# Patient Record
Sex: Male | Born: 1970 | Race: White | Hispanic: No | Marital: Married | Smoking: Never smoker
Health system: Southern US, Community
[De-identification: ages and names within clinical notes are randomized; demographics above are authoritative.]

## PROBLEM LIST (undated history)

## (undated) DIAGNOSIS — N200 Calculus of kidney: Secondary | ICD-10-CM

## (undated) DIAGNOSIS — M1990 Unspecified osteoarthritis, unspecified site: Secondary | ICD-10-CM

## (undated) DIAGNOSIS — F419 Anxiety disorder, unspecified: Secondary | ICD-10-CM

## (undated) DIAGNOSIS — K469 Unspecified abdominal hernia without obstruction or gangrene: Secondary | ICD-10-CM

## (undated) HISTORY — PX: HERNIA REPAIR: SHX51

---
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
## 2003-08-02 ENCOUNTER — Emergency Department (HOSPITAL_COMMUNITY): Admission: EM | Admit: 2003-08-02 | Discharge: 2003-08-02 | Payer: Self-pay | Admitting: Emergency Medicine

## 2014-02-04 ENCOUNTER — Encounter (HOSPITAL_COMMUNITY): Payer: Self-pay | Admitting: Emergency Medicine

## 2014-02-04 ENCOUNTER — Inpatient Hospital Stay (HOSPITAL_COMMUNITY)
Admission: EM | Admit: 2014-02-04 | Discharge: 2014-02-11 | DRG: 414 | Disposition: A | Discharge status: Home / Self Care | Payer: 59 | Attending: Family Medicine | Admitting: Family Medicine

## 2014-02-04 DIAGNOSIS — K859 Acute pancreatitis without necrosis or infection, unspecified: Secondary | ICD-10-CM | POA: Diagnosis present

## 2014-02-04 DIAGNOSIS — R1013 Epigastric pain: Secondary | ICD-10-CM

## 2014-02-04 DIAGNOSIS — K436 Other and unspecified ventral hernia with obstruction, without gangrene: Secondary | ICD-10-CM | POA: Diagnosis present

## 2014-02-04 DIAGNOSIS — Z6837 Body mass index (BMI) 37.0-37.9, adult: Secondary | ICD-10-CM

## 2014-02-04 DIAGNOSIS — R945 Abnormal results of liver function studies: Secondary | ICD-10-CM

## 2014-02-04 DIAGNOSIS — K8000 Calculus of gallbladder with acute cholecystitis without obstruction: Principal | ICD-10-CM | POA: Diagnosis present

## 2014-02-04 DIAGNOSIS — K429 Umbilical hernia without obstruction or gangrene: Secondary | ICD-10-CM | POA: Diagnosis present

## 2014-02-04 DIAGNOSIS — E876 Hypokalemia: Secondary | ICD-10-CM

## 2014-02-04 DIAGNOSIS — K8020 Calculus of gallbladder without cholecystitis without obstruction: Secondary | ICD-10-CM | POA: Diagnosis present

## 2014-02-04 DIAGNOSIS — K760 Fatty (change of) liver, not elsewhere classified: Secondary | ICD-10-CM | POA: Diagnosis present

## 2014-02-04 DIAGNOSIS — R7989 Other specified abnormal findings of blood chemistry: Secondary | ICD-10-CM | POA: Diagnosis present

## 2014-02-04 DIAGNOSIS — K851 Biliary acute pancreatitis: Secondary | ICD-10-CM | POA: Diagnosis present

## 2014-02-04 HISTORY — DX: Calculus of kidney: N20.0

## 2014-02-04 HISTORY — DX: Unspecified abdominal hernia without obstruction or gangrene: K46.9

## 2014-02-04 LAB — CBC WITH DIFFERENTIAL/PLATELET
Basophils Absolute: 0 10*3/uL (ref 0.0–0.1)
Basophils Relative: 0 % (ref 0–1)
EOS ABS: 0 10*3/uL (ref 0.0–0.7)
EOS PCT: 0 % (ref 0–5)
HCT: 44.9 % (ref 39.0–52.0)
Hemoglobin: 16.3 g/dL (ref 13.0–17.0)
Lymphocytes Relative: 18 % (ref 12–46)
Lymphs Abs: 1.3 10*3/uL (ref 0.7–4.0)
MCH: 31.6 pg (ref 26.0–34.0)
MCHC: 36.3 g/dL — ABNORMAL HIGH (ref 30.0–36.0)
MCV: 87 fL (ref 78.0–100.0)
MONO ABS: 0.5 10*3/uL (ref 0.1–1.0)
MONOS PCT: 7 % (ref 3–12)
NEUTROS ABS: 5.5 10*3/uL (ref 1.7–7.7)
Neutrophils Relative %: 75 % (ref 43–77)
PLATELETS: 257 10*3/uL (ref 150–400)
RBC: 5.16 MIL/uL (ref 4.22–5.81)
RDW: 12.2 % (ref 11.5–15.5)
WBC: 7.3 10*3/uL (ref 4.0–10.5)

## 2014-02-04 NOTE — ED Notes (Signed)
sts abd pain and vomiting today, no D/F, no urinary s/s, A/O X4, ambulatory and in NAD

## 2014-02-05 ENCOUNTER — Emergency Department (HOSPITAL_COMMUNITY): Payer: 59

## 2014-02-05 ENCOUNTER — Encounter (HOSPITAL_COMMUNITY): Payer: Self-pay | Admitting: Radiology

## 2014-02-05 DIAGNOSIS — K859 Acute pancreatitis without necrosis or infection, unspecified: Secondary | ICD-10-CM | POA: Diagnosis present

## 2014-02-05 DIAGNOSIS — K8020 Calculus of gallbladder without cholecystitis without obstruction: Secondary | ICD-10-CM | POA: Diagnosis present

## 2014-02-05 DIAGNOSIS — K760 Fatty (change of) liver, not elsewhere classified: Secondary | ICD-10-CM | POA: Diagnosis present

## 2014-02-05 DIAGNOSIS — K436 Other and unspecified ventral hernia with obstruction, without gangrene: Secondary | ICD-10-CM | POA: Diagnosis present

## 2014-02-05 DIAGNOSIS — K429 Umbilical hernia without obstruction or gangrene: Secondary | ICD-10-CM | POA: Diagnosis present

## 2014-02-05 DIAGNOSIS — E876 Hypokalemia: Secondary | ICD-10-CM | POA: Diagnosis not present

## 2014-02-05 DIAGNOSIS — R7989 Other specified abnormal findings of blood chemistry: Secondary | ICD-10-CM | POA: Diagnosis present

## 2014-02-05 DIAGNOSIS — K851 Biliary acute pancreatitis: Secondary | ICD-10-CM | POA: Diagnosis present

## 2014-02-05 DIAGNOSIS — R1013 Epigastric pain: Secondary | ICD-10-CM | POA: Diagnosis present

## 2014-02-05 DIAGNOSIS — K8000 Calculus of gallbladder with acute cholecystitis without obstruction: Secondary | ICD-10-CM | POA: Diagnosis present

## 2014-02-05 DIAGNOSIS — R945 Abnormal results of liver function studies: Secondary | ICD-10-CM

## 2014-02-05 DIAGNOSIS — Z6837 Body mass index (BMI) 37.0-37.9, adult: Secondary | ICD-10-CM | POA: Diagnosis not present

## 2014-02-05 LAB — COMPREHENSIVE METABOLIC PANEL
ALK PHOS: 110 U/L (ref 39–117)
ALT: 420 U/L — AB (ref 0–53)
ALT: 480 U/L — ABNORMAL HIGH (ref 0–53)
ANION GAP: 14 (ref 5–15)
AST: 525 U/L — ABNORMAL HIGH (ref 0–37)
AST: 491 U/L — AB (ref 0–37)
Albumin: 4.3 g/dL (ref 3.5–5.2)
Albumin: 3.9 g/dL (ref 3.5–5.2)
Alkaline Phosphatase: 118 U/L — ABNORMAL HIGH (ref 39–117)
Anion gap: 15 (ref 5–15)
BILIRUBIN TOTAL: 2.7 mg/dL — AB (ref 0.3–1.2)
BUN: 14 mg/dL (ref 6–23)
BUN: 14 mg/dL (ref 6–23)
CALCIUM: 9.9 mg/dL (ref 8.4–10.5)
CHLORIDE: 99 meq/L (ref 96–112)
CHLORIDE: 102 meq/L (ref 96–112)
CO2: 26 meq/L (ref 19–32)
CO2: 22 meq/L (ref 19–32)
CREATININE: 1.05 mg/dL (ref 0.50–1.35)
CREATININE: 0.98 mg/dL (ref 0.50–1.35)
Calcium: 9 mg/dL (ref 8.4–10.5)
GFR calc Af Amer: >90 mL/min (ref >90)
GFR calc Af Amer: >90 mL/min (ref >90)
GFR, EST NON AFRICAN AMERICAN: 84 mL/min — AB (ref >90)
GLUCOSE: 158 mg/dL — AB (ref 70–99)
Glucose, Bld: 135 mg/dL — ABNORMAL HIGH (ref 70–99)
POTASSIUM: 4.4 meq/L (ref 3.7–5.3)
Potassium: 4.2 mEq/L (ref 3.7–5.3)
Sodium: 139 mEq/L (ref 137–147)
Sodium: 139 mEq/L (ref 137–147)
Total Bilirubin: 2.6 mg/dL — ABNORMAL HIGH (ref 0.3–1.2)
Total Protein: 7.9 g/dL (ref 6.0–8.3)
Total Protein: 7.3 g/dL (ref 6.0–8.3)

## 2014-02-05 LAB — CBC WITH DIFFERENTIAL/PLATELET
Basophils Absolute: 0 10*3/uL (ref 0.0–0.1)
Basophils Relative: 0 % (ref 0–1)
EOS PCT: 0 % (ref 0–5)
Eosinophils Absolute: 0 10*3/uL (ref 0.0–0.7)
HEMATOCRIT: 42.1 % (ref 39.0–52.0)
HEMOGLOBIN: 15 g/dL (ref 13.0–17.0)
LYMPHS ABS: 0.7 10*3/uL (ref 0.7–4.0)
LYMPHS PCT: 6 % — AB (ref 12–46)
MCH: 31.3 pg (ref 26.0–34.0)
MCHC: 35.6 g/dL (ref 30.0–36.0)
MCV: 87.7 fL (ref 78.0–100.0)
MONOS PCT: 9 % (ref 3–12)
Monocytes Absolute: 1.2 10*3/uL — ABNORMAL HIGH (ref 0.1–1.0)
Neutro Abs: 10.5 10*3/uL — ABNORMAL HIGH (ref 1.7–7.7)
Neutrophils Relative %: 85 % — ABNORMAL HIGH (ref 43–77)
Platelets: 241 10*3/uL (ref 150–400)
RBC: 4.8 MIL/uL (ref 4.22–5.81)
RDW: 12.6 % (ref 11.5–15.5)
WBC: 12.3 10*3/uL — ABNORMAL HIGH (ref 4.0–10.5)

## 2014-02-05 LAB — RAPID URINE DRUG SCREEN, HOSP PERFORMED
AMPHETAMINES: NOT DETECTED
Barbiturates: NOT DETECTED
Benzodiazepines: NOT DETECTED
Cocaine: NOT DETECTED
OPIATES: POSITIVE — AB
Tetrahydrocannabinol: NOT DETECTED

## 2014-02-05 LAB — HEPATITIS PANEL, ACUTE
HCV Ab: NEGATIVE
HEP B C IGM: NONREACTIVE
HEP B S AG: NEGATIVE
Hep A IgM: NONREACTIVE

## 2014-02-05 LAB — ACETAMINOPHEN LEVEL

## 2014-02-05 LAB — LIPASE, BLOOD: LIPASE: 1052 U/L — AB (ref 11–59)

## 2014-02-05 LAB — PROTIME-INR
INR: 1.09 (ref 0.00–1.49)
Prothrombin Time: 14.3 seconds (ref 11.6–15.2)

## 2014-02-05 LAB — ETHANOL

## 2014-02-05 LAB — I-STAT CG4 LACTIC ACID, ED: LACTIC ACID, VENOUS: 1.3 mmol/L (ref 0.5–2.2)

## 2014-02-05 MED ORDER — PANTOPRAZOLE SODIUM 40 MG IV SOLR
40.0000 mg | INTRAVENOUS | Status: DC
  Administered 2014-02-05 – 2014-02-07 (×3): 40 mg via INTRAVENOUS
  Filled 2014-02-05 (×4): qty 40

## 2014-02-05 MED ORDER — HYDROMORPHONE HCL 1 MG/ML IJ SOLN
1.0000 mg | Freq: Once | INTRAMUSCULAR | Status: AC
  Administered 2014-02-05: 1 mg via INTRAVENOUS
  Filled 2014-02-05: qty 1

## 2014-02-05 MED ORDER — ENOXAPARIN SODIUM 40 MG/0.4ML ~~LOC~~ SOLN
40.0000 mg | SUBCUTANEOUS | Status: DC
  Administered 2014-02-05 – 2014-02-07 (×3): 40 mg via SUBCUTANEOUS
  Filled 2014-02-05 (×5): qty 0.4

## 2014-02-05 MED ORDER — SODIUM CHLORIDE 0.9 % IV BOLUS (SEPSIS)
1000.0000 mL | Freq: Once | INTRAVENOUS | Status: AC
  Administered 2014-02-05: 1000 mL via INTRAVENOUS

## 2014-02-05 MED ORDER — SODIUM CHLORIDE 0.9 % IV SOLN
INTRAVENOUS | Status: DC
  Administered 2014-02-05 – 2014-02-09 (×8): via INTRAVENOUS

## 2014-02-05 MED ORDER — ONDANSETRON HCL 4 MG/2ML IJ SOLN: 4.0000 mg | Freq: Four times a day (QID) | INTRAMUSCULAR | Status: DC | PRN

## 2014-02-05 MED ORDER — ONDANSETRON HCL 4 MG/2ML IJ SOLN
4.0000 mg | Freq: Once | INTRAMUSCULAR | Status: AC
  Administered 2014-02-05: 4 mg via INTRAVENOUS

## 2014-02-05 MED ORDER — ONDANSETRON HCL 4 MG PO TABS: 4.0000 mg | ORAL_TABLET | Freq: Four times a day (QID) | ORAL | Status: DC | PRN

## 2014-02-05 MED ORDER — HYDROMORPHONE HCL 1 MG/ML IJ SOLN
1.0000 mg | INTRAMUSCULAR | Status: DC | PRN
  Administered 2014-02-05 – 2014-02-07 (×16): 1 mg via INTRAVENOUS
  Filled 2014-02-05 (×18): qty 1

## 2014-02-05 MED ORDER — ONDANSETRON 8 MG/NS 50 ML IVPB
8.0000 mg | Freq: Four times a day (QID) | INTRAVENOUS | Status: DC | PRN
  Administered 2014-02-05 – 2014-02-06 (×4): 8 mg via INTRAVENOUS
  Filled 2014-02-05 (×4): qty 8

## 2014-02-05 MED ORDER — IOHEXOL 300 MG/ML  SOLN
100.0000 mL | Freq: Once | INTRAMUSCULAR | Status: AC | PRN
  Administered 2014-02-05: 100 mL via INTRAVENOUS

## 2014-02-05 NOTE — ED Notes (Signed)
Patient transported to CT 

## 2014-02-05 NOTE — Progress Notes (Signed)
Utilization review completed. Tico Crotteau, RN, BSN. 

## 2014-02-05 NOTE — Consult Note (Signed)
Reason for Consult: Biliary pancreatitis Referring Physician: Dr. Marylene Buerger [name] is an 43 y.o. male.  HPI: [name] [name] generalized abdominal pain yesterday morning. Mostly located across [name] [name] upper abdomen. 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 [name] [name] came to the emergency room and underwent CT scan. This demonstrates gallstones without evidence of cholecystitis but does show pancreatitis. Liver function tests are elevated as well as [name] [name] lipase. I was asked to see [name] [name] in consultation for biliary pancreatitis. [name] [name] is very worried about the possibility of needing surgery.  Past Medical History  Diagnosis Date  . Hernia of abdominal cavity     History reviewed. No pertinent past surgical history.  No family history on file.  Social History:  reports that [name] [name] has never smoked. [name] [name] does not have any smokeless tobacco history on file. [name] [name] reports that [name] [name] drinks alcohol. [name] [name] drug history is not on file.  Allergies: No Known Allergies  Medications: Prior to Admission:  (Not in a hospital admission)  Results for orders placed or performed during the hospital encounter of 02/04/14 (from the past 48 hour(s))  CBC with Differential     Status: Abnormal   Collection Time: 02/04/14 11:23 PM  Result Value Ref Range   WBC 7.3 4.0 - 10.5 K/uL   RBC 5.16 4.22 - 5.81 MIL/uL   Hemoglobin 16.3 13.0 - 17.0 g/dL   HCT 44.9 39.0 - 52.0 %   MCV 87.0 78.0 - 100.0 fL   MCH 31.6 26.0 - 34.0 pg   MCHC 36.3 (H) 30.0 - 36.0 g/dL   RDW 12.2 11.5 - 15.5 %   Platelets 257 150 - 400 K/uL   Neutrophils Relative % 75 43 - 77 %   Neutro Abs 5.5 1.7 - 7.7 K/uL   Lymphocytes Relative 18 12 - 46 %   Lymphs Abs 1.3 0.7 - 4.0 K/uL   Monocytes Relative 7 3 - 12 %   Monocytes Absolute 0.5 0.1 - 1.0 K/uL   Eosinophils Relative 0 0 - 5 %   Eosinophils Absolute 0.0 0.0 - 0.7 K/uL   Basophils Relative 0 0 - 1 %   Basophils Absolute 0.0 0.0 - 0.1 K/uL    Comprehensive metabolic panel     Status: Abnormal   Collection Time: 02/04/14 11:23 PM  Result Value Ref Range   Sodium 139 137 - 147 mEq/L   Potassium 4.2 3.7 - 5.3 mEq/L   Chloride 99 96 - 112 mEq/L   CO2 26 19 - 32 mEq/L   Glucose, Bld 158 (H) 70 - 99 mg/dL   BUN 14 6 - 23 mg/dL   Creatinine, Ser 1.05 0.50 - 1.35 mg/dL   Calcium 9.9 8.4 - 10.5 mg/dL   Total Protein 7.9 6.0 - 8.3 g/dL   Albumin 4.3 3.5 - 5.2 g/dL   AST 525 (H) 0 - 37 U/L   ALT 420 (H) 0 - 53 U/L   Alkaline Phosphatase 118 (H) 39 - 117 U/L   Total Bilirubin 2.6 (H) 0.3 - 1.2 mg/dL   GFR calc non Af Amer 84 (L) >90 mL/min   GFR calc Af Amer >90 >90 mL/min    Comment: (NOTE) The eGFR has been calculated using the CKD EPI equation. This calculation has not been validated in all clinical situations. eGFR's persistently <90 mL/min signify possible Chronic Kidney Disease.    Anion gap 14 5 - 15  Lipase, blood  Status: Abnormal   Collection Time: 02/04/14 11:23 PM  Result Value Ref Range   Lipase 1052 (H) 11 - 59 U/L  I-Stat CG4 Lactic Acid, ED     Status: None   Collection Time: 02/05/14 12:28 AM  Result Value Ref Range   Lactic Acid, Venous 1.30 0.5 - 2.2 mmol/L    Ct Abdomen Pelvis W Contrast  02/05/2014   CLINICAL DATA:  Diffuse abdominal pain, sharp stabbing pain beginning this morning. History of abdominal hernia.  EXAM: CT ABDOMEN AND PELVIS WITH CONTRAST  TECHNIQUE: Multidetector CT imaging of the abdomen and pelvis was performed using the standard protocol following bolus administration of intravenous contrast.  CONTRAST:  131m OMNIPAQUE IOHEXOL 300 MG/ML  SOLN  COMPARISON:  None.  FINDINGS: LUNG BASES: Lingular wedge-like consolidation with ground-glass nodular density. LEFT pleural thickening. Heart size is normal, no pericardial fluid collections.  SOLID ORGANS: The liver is diffusely hypodense most consistent with steatosis. Sub cm cyst in LEFT lobe of the liver. Tiny layering gallstones, extending  to the gallbladder neck without gallbladder distension or inflammatory changes. Mild inflammatory changes and trace free fluid about the pancreatic head and duodenum, coronal 92/162. Homogeneous enhancement of the pancreas, no pancreatic duct dilatation. Normal appearance of the spleen and adrenal glands.  GASTROINTESTINAL TRACT: The stomach, small and large bowel are normal in course and caliber without inflammatory changes. Normal appendix.  KIDNEYS/ URINARY TRACT: Kidneys are orthotopic, demonstrating symmetric enhancement. No nephrolithiasis, hydronephrosis or solid renal masses. The unopacified ureters are normal in course and caliber. Delayed imaging through the kidneys demonstrates symmetric prompt contrast excretion within the proximal urinary collecting system. Urinary bladder is partially distended and unremarkable.  PERITONEUM/RETROPERITONEUM: No intraperitoneal free air. Aortoiliac vessels are normal in course and caliber. No lymphadenopathy by CT size criteria. Internal reproductive organs are unremarkable.  SOFT TISSUE/OSSEOUS STRUCTURES: Ill-defined geographic sclerotic lesion in the LEFT femur metadiaphysis may reflect enchondroma. Nonspecific 8 mm subcentimeter sclerotic lesion LEFT eighth rib could reflect bone island. Severe L5-S1 degenerative disc. Moderate fat containing ventral hernia. Small fat containing umbilical hernia. Small bilateral fat containing inguinal hernias. Rectus abdominis diastases.  IMPRESSION: Mild inflammatory changes about the pancreas, duodenum suggesting pancreatitis without necrosis.  Cholelithiasis, no CT findings of acute cholecystitis.  Markedly hepatic steatosis, superimposed hepatitis not excluded. Lingular atelectasis and pleural thickening, less likely superimposed pneumonia.   Electronically Signed   By: CElon Alas  On: 02/05/2014 02:10    Review of Systems  Constitutional: Negative for fever and chills.  HENT: Negative.   Eyes: Negative.     Cardiovascular: Negative.   Gastrointestinal: Positive for abdominal pain. Negative for blood in stool.       Ventral hernia [name] [name] reports having all [name] [name] life which at times spontaneously reduces, reports umbilical hernia for 9 months  Genitourinary: Negative.   Musculoskeletal: Negative.   Skin: Negative.   Neurological: Negative.   Endo/Heme/Allergies: Negative.   Psychiatric/Behavioral: Negative.    Blood pressure 132/85, pulse 75, temperature 98 F (36.7 C), temperature source Oral, resp. rate 16, height 6' (1.829 m), weight 240 lb (108.863 kg), SpO2 92 %. Physical Exam  Constitutional: [name] [name] is oriented to person, place, and time. [name] [name] appears well-[name] and well-nourished. No distress.  HENT:  Head: Normocephalic and atraumatic.  Right Ear: External ear normal.  Left Ear: External ear normal.  Nose: Nose normal.  Mouth/Throat: Oropharynx is clear and moist.  Eyes: EOM are normal. Pupils are equal, round, and reactive to light.  Neck: Normal  range of motion. Neck supple.  Cardiovascular: Normal rate, normal heart sounds and intact distal pulses.   Respiratory: Effort normal and breath sounds normal. No respiratory distress. [name] [name] has no wheezes. [name] [name] has no rales.  GI: Soft. [name] [name] exhibits no distension. There is tenderness. There is no rebound and no guarding.    Moderate size ventral hernia which does not completely reduce, mildly tender on palpation. Umbilical hernia does not easily reduce, mildly tender on palpation. Minimal epigastric tenderness without guarding  Musculoskeletal: Normal range of motion.  Neurological: [name] [name] is alert and oriented to person, place, and time. [name] [name] exhibits normal muscle tone.  Skin: Skin is warm and dry.  Psychiatric: [name] [name] has a normal mood and affect.    Assessment/Plan: Biliary pancreatitis Ventral hernia Umbilical hernia  Agree with medical admission and GI consultation. NPO. IV fluids. Follow-up labs. May need MRCP or ERCP. Will follow. We will plan  laparoscopic cholecystectomy once pancreatitis resolves. Will likely also repair ventral and umbilical hernias at that time. Plan was discussed with [name] [name] and [name] [name] wife.  Soyla Bainter E 02/05/2014, 4:07 AM

## 2014-02-05 NOTE — ED Provider Notes (Signed)
CSN: [id number]     Arrival date & time 02/04/14  2243 History  This chart was scribed for Varney Biles, MD by Evelene Croon, ED Scribe. This patient was seen in room D33C/D33C and the patient's care was started 12:05 AM.    Chief Complaint  Patient presents with  . Abdominal Pain     The history is provided by the patient. No language interpreter was used.     HPI Comments:  [name] [name] is a 43 y.o. male with a h/o abdominal hernia who presents to the Emergency Department complaining of moderate diffuse non-radiating abdominal pain that started around 0800 this am while at work. Pt describes his pain as knives sitting in his stomach.  He reports associated nausea with 7 episodes of vomiting. Pt notes pain is improved when seated upright. He denies a h/o abdominal surgeries, h/o abdominal pain after eating prior to today, and chronic ETOH consumption. He reports a  h/o kidney stones pain notes pain today is dissimilar to pain felt with kidney stone. Pt also denies dysuria, hematuria and diarrhea. Last normal bowel movement was this am.     Past Medical History  Diagnosis Date  . Hernia of abdominal cavity    History reviewed. No pertinent past surgical history. No family history on file. History  Substance Use Topics  . Smoking status: Never Smoker   . Smokeless tobacco: Not on file  . Alcohol Use: Yes    Review of Systems  Gastrointestinal: Positive for nausea, vomiting and abdominal pain. Negative for diarrhea.  Genitourinary: Negative for dysuria and hematuria.  All other systems reviewed and are negative.     Allergies  Review of patient's allergies indicates no known allergies.  Home Medications   Prior to Admission medications   Medication Sig Start Date End Date Taking? Authorizing Provider  acetaminophen (TYLENOL) 325 MG tablet Take 650 mg by mouth every 6 (six) hours as needed for mild pain.   Yes Historical Provider, MD   BP 147/97 mmHg  Pulse 105   Temp(Src) 99.9 F (37.7 C) (Axillary)  Resp 18  Ht 6' (1.829 m)  Wt 270 lb 8 oz (122.698 kg)  BMI 36.68 kg/m2  SpO2 96% Physical Exam  Constitutional: He is oriented to person, place, and time. He appears well-developed and well-nourished.  HENT:  Head: Normocephalic and atraumatic.  Eyes: Conjunctivae are normal.  Cardiovascular: Regular rhythm and normal heart sounds.   No murmur heard. Tachycardia  Pulmonary/Chest: Breath sounds normal. No respiratory distress.  Abdominal:  Hyperactive bowel sounds. Abdomen is firm Ventral hernia noted with overlying erythema and TTP  Neurological: He is alert and oriented to person, place, and time.  Skin: Skin is warm. He is diaphoretic.  Psychiatric: He has a normal mood and affect.  Nursing note and vitals reviewed.   ED Course  Procedures   DIAGNOSTIC STUDIES:  Oxygen Saturation is 98% on RA, normal by my interpretation.    COORDINATION OF CARE:  12:13 AM Discussed treatment plan with pt at bedside and pt agreed to plan. 3:28 AM Pt re-evaluated, updated with results and plan to admit.  Labs Review Labs Reviewed  CBC WITH DIFFERENTIAL - Abnormal; Notable for the following:    MCHC 36.3 (*)    All other components within normal limits  COMPREHENSIVE METABOLIC PANEL - Abnormal; Notable for the following:    Glucose, Bld 158 (*)    AST 525 (*)    ALT 420 (*)    Alkaline Phosphatase  118 (*)    Total Bilirubin 2.6 (*)    GFR calc non Af Amer 84 (*)    All other components within normal limits  LIPASE, BLOOD - Abnormal; Notable for the following:    Lipase 1052 (*)    All other components within normal limits  CBC WITH DIFFERENTIAL - Abnormal; Notable for the following:    WBC 12.3 (*)    Neutrophils Relative % 85 (*)    Neutro Abs 10.5 (*)    Lymphocytes Relative 6 (*)    Monocytes Absolute 1.2 (*)    All other components within normal limits  COMPREHENSIVE METABOLIC PANEL - Abnormal; Notable for the following:     Glucose, Bld 135 (*)    AST 491 (*)    ALT 480 (*)    Total Bilirubin 2.7 (*)    All other components within normal limits  PROTIME-INR  URINE RAPID DRUG SCREEN (HOSP PERFORMED)  ETHANOL  ACETAMINOPHEN LEVEL  HEPATITIS PANEL, ACUTE  I-STAT CG4 LACTIC ACID, ED    Imaging Review Ct Abdomen Pelvis W Contrast  02/05/2014   CLINICAL DATA:  Diffuse abdominal pain, sharp stabbing pain beginning this morning. History of abdominal hernia.  EXAM: CT ABDOMEN AND PELVIS WITH CONTRAST  TECHNIQUE: Multidetector CT imaging of the abdomen and pelvis was performed using the standard protocol following bolus administration of intravenous contrast.  CONTRAST:  157mL OMNIPAQUE IOHEXOL 300 MG/ML  SOLN  COMPARISON:  None.  FINDINGS: LUNG BASES: Lingular wedge-like consolidation with ground-glass nodular density. LEFT pleural thickening. Heart size is normal, no pericardial fluid collections.  SOLID ORGANS: The liver is diffusely hypodense most consistent with steatosis. Sub cm cyst in LEFT lobe of the liver. Tiny layering gallstones, extending to the gallbladder neck without gallbladder distension or inflammatory changes. Mild inflammatory changes and trace free fluid about the pancreatic head and duodenum, coronal 92/162. Homogeneous enhancement of the pancreas, no pancreatic duct dilatation. Normal appearance of the spleen and adrenal glands.  GASTROINTESTINAL TRACT: The stomach, small and large bowel are normal in course and caliber without inflammatory changes. Normal appendix.  KIDNEYS/ URINARY TRACT: Kidneys are orthotopic, demonstrating symmetric enhancement. No nephrolithiasis, hydronephrosis or solid renal masses. The unopacified ureters are normal in course and caliber. Delayed imaging through the kidneys demonstrates symmetric prompt contrast excretion within the proximal urinary collecting system. Urinary bladder is partially distended and unremarkable.  PERITONEUM/RETROPERITONEUM: No intraperitoneal free air.  Aortoiliac vessels are normal in course and caliber. No lymphadenopathy by CT size criteria. Internal reproductive organs are unremarkable.  SOFT TISSUE/OSSEOUS STRUCTURES: Ill-defined geographic sclerotic lesion in the LEFT femur metadiaphysis may reflect enchondroma. Nonspecific 8 mm subcentimeter sclerotic lesion LEFT eighth rib could reflect bone island. Severe L5-S1 degenerative disc. Moderate fat containing ventral hernia. Small fat containing umbilical hernia. Small bilateral fat containing inguinal hernias. Rectus abdominis diastases.  IMPRESSION: Mild inflammatory changes about the pancreas, duodenum suggesting pancreatitis without necrosis.  Cholelithiasis, no CT findings of acute cholecystitis.  Markedly hepatic steatosis, superimposed hepatitis not excluded. Lingular atelectasis and pleural thickening, less likely superimposed pneumonia.   Electronically Signed   By: Elon Alas   On: 02/05/2014 02:10     EKG Interpretation None      MDM   Final diagnoses:  Epigastric abdominal pain  Acute pancreatitis, unspecified pancreatitis type    Pt comes in with abd pain, diffuse in the upper quadrant that is new and started today. He has hx of ventral hernia. Abd is distended, severe tenderness on exam, and  pt is diophoretic. Pt has elevated liver enz, and also lipase and bilirubin. Pt has tender ventral hernia as well. CT ordered - as we were unsure if he had gall stone pancreatitis, or there was incarceration of hernia and related inflammation affecting the pancreas.  CT shows gall stones pancreatitis-  And Dr. Grandville Silos from Surgery will see the patient, whilst Medicine will admit.   Varney Biles, MD 02/05/14 [phone]

## 2014-02-05 NOTE — ED Notes (Signed)
Pt actively vomiting, protocol zofran placed.

## 2014-02-05 NOTE — ED Notes (Signed)
MD at bedside. 

## 2014-02-05 NOTE — Progress Notes (Signed)
New Admission Note:   Arrival: via stretcher from ED with RN Mental Orientation: A&Ox4 Telemetry: none ordered IV: right AC, infusing Pain: none currently, received pain med in ED Safety Measures:  Call bell placed within reach; patient instructed on use of call bell and verbalized understanding. Bed in lowest position.  Yellow bracelet on.  Non-skid socks on.  Bed alarm on. [address] Orientation: Patient oriented to staff, room, and unit. Family: none at bedside  Orders have been reviewed and implemented. Will continue to monitor.  Arlyss Queen, RN, BSN

## 2014-02-05 NOTE — ED Notes (Signed)
Pt reports his pain has returned, MD informed. See new orders.

## 2014-02-05 NOTE — H&P (Signed)
[facility] History and Physical  Patient: [name] [name]  EHM:[id number]  DOB: [birth year]  DOS: the patient was seen and examined on 02/05/2014 PCP: No primary care provider on file.  Chief Complaint: Abdominal pain  HPI: [name] [name] is a 43 y.o. male with Past medical history of morbid obesity and hernia. Patient presented with numbness of abdominal pain that has been ongoing since yesterday. The pain actually has been ongoing on and off since last 3-4 days but worsened since 8 AM yesterday and has present continuously since then. He also started having nausea and started having vomiting today. He denies any fever or chills denies any chest pain or shortness of breath denies any burning urination denies any constipation. He had some loose bowel motions. He denies any active bleeding. He is not passing gas at present. He denies any alcohol abuse denies any Tylenol abuse denies any drug abuse. He denies any prior abdominal surgery.  The patient is coming from home. And at his baseline independent for most of his ADL.  Review of Systems: as mentioned in the history of present illness.  A Comprehensive review of the other systems is negative.  Past Medical History  Diagnosis Date  . Hernia of abdominal cavity    History reviewed. No pertinent past surgical history. Social History:  reports that he has never smoked. He does not have any smokeless tobacco history on file. He reports that he drinks alcohol. His drug history is not on file.  No Known Allergies  No family history on file.  Prior to Admission medications   Medication Sig Start Date End Date Taking? Authorizing Provider  acetaminophen (TYLENOL) 325 MG tablet Take 650 mg by mouth every 6 (six) hours as needed for mild pain.   Yes Historical Provider, MD    Physical Exam: Filed Vitals:   02/05/14 0515 02/05/14 0530 02/05/14 0545 02/05/14 0613  BP: 147/92 141/72 132/88 147/97  Pulse: 107 105 105 105   Temp:    99.9 F (37.7 C)  TempSrc:    Axillary  Resp: 16 13 17 18   Height:    6' (1.829 m)  Weight:    122.698 kg (270 lb 8 oz)  SpO2: 98% 97% 97% 96%    General: Alert, Awake and Oriented to Time, Place and Person. Appear in mild distress Eyes: PERRL ENT: Oral Mucosa clear moist. Neck: no JVD Cardiovascular: S1 and S2 Present, no Murmur, Peripheral Pulses Present Respiratory: Bilateral Air entry equal and Decreased, Clear to Auscultation, noCrackles, no wheezes Abdomen: Bowel Sound present sluggish, Soft and distended and diffusely tender Skin: no Rash Extremities: no Pedal edema, no calf tenderness Neurologic: Grossly no focal neuro deficit.  Labs on Admission:  CBC:  Recent Labs Lab 02/04/14 2323 02/05/14 0532  WBC 7.3 12.3*  NEUTROABS 5.5 10.5*  HGB 16.3 15.0  HCT 44.9 42.1  MCV 87.0 87.7  PLT 257 241    CMP     Component Value Date/Time   NA 139 02/05/2014 0532   K 4.4 02/05/2014 0532   CL 102 02/05/2014 0532   CO2 22 02/05/2014 0532   GLUCOSE 135* 02/05/2014 0532   BUN 14 02/05/2014 0532   CREATININE 0.98 02/05/2014 0532   CALCIUM 9.0 02/05/2014 0532   PROT 7.3 02/05/2014 0532   ALBUMIN 3.9 02/05/2014 0532   AST 491* 02/05/2014 0532   ALT 480* 02/05/2014 0532   ALKPHOS 110 02/05/2014 0532   BILITOT 2.7* 02/05/2014 0532   GFRNONAA >90 02/05/2014 0532  GFRAA >90 02/05/2014 0532     Recent Labs Lab 02/04/14 2323  LIPASE 1052*   No results for input(s): AMMONIA in the last 168 hours.  No results for input(s): CKTOTAL, CKMB, CKMBINDEX, TROPONINI in the last 168 hours. BNP (last 3 results) No results for input(s): PROBNP in the last 8760 hours.  Radiological Exams on Admission: Ct Abdomen Pelvis W Contrast  02/05/2014   CLINICAL DATA:  Diffuse abdominal pain, sharp stabbing pain beginning this morning. History of abdominal hernia.  EXAM: CT ABDOMEN AND PELVIS WITH CONTRAST  TECHNIQUE: Multidetector CT imaging of the abdomen and pelvis was  performed using the standard protocol following bolus administration of intravenous contrast.  CONTRAST:  164mL OMNIPAQUE IOHEXOL 300 MG/ML  SOLN  COMPARISON:  None.  FINDINGS: LUNG BASES: Lingular wedge-like consolidation with ground-glass nodular density. LEFT pleural thickening. Heart size is normal, no pericardial fluid collections.  SOLID ORGANS: The liver is diffusely hypodense most consistent with steatosis. Sub cm cyst in LEFT lobe of the liver. Tiny layering gallstones, extending to the gallbladder neck without gallbladder distension or inflammatory changes. Mild inflammatory changes and trace free fluid about the pancreatic head and duodenum, coronal 92/162. Homogeneous enhancement of the pancreas, no pancreatic duct dilatation. Normal appearance of the spleen and adrenal glands.  GASTROINTESTINAL TRACT: The stomach, small and large bowel are normal in course and caliber without inflammatory changes. Normal appendix.  KIDNEYS/ URINARY TRACT: Kidneys are orthotopic, demonstrating symmetric enhancement. No nephrolithiasis, hydronephrosis or solid renal masses. The unopacified ureters are normal in course and caliber. Delayed imaging through the kidneys demonstrates symmetric prompt contrast excretion within the proximal urinary collecting system. Urinary bladder is partially distended and unremarkable.  PERITONEUM/RETROPERITONEUM: No intraperitoneal free air. Aortoiliac vessels are normal in course and caliber. No lymphadenopathy by CT size criteria. Internal reproductive organs are unremarkable.  SOFT TISSUE/OSSEOUS STRUCTURES: Ill-defined geographic sclerotic lesion in the LEFT femur metadiaphysis may reflect enchondroma. Nonspecific 8 mm subcentimeter sclerotic lesion LEFT eighth rib could reflect bone island. Severe L5-S1 degenerative disc. Moderate fat containing ventral hernia. Small fat containing umbilical hernia. Small bilateral fat containing inguinal hernias. Rectus abdominis diastases.   IMPRESSION: Mild inflammatory changes about the pancreas, duodenum suggesting pancreatitis without necrosis.  Cholelithiasis, no CT findings of acute cholecystitis.  Markedly hepatic steatosis, superimposed hepatitis not excluded. Lingular atelectasis and pleural thickening, less likely superimposed pneumonia.   Electronically Signed   By: Elon Alas   On: 02/05/2014 02:10    Assessment/Plan Principal Problem:   Acute pancreatitis Active Problems:   Cholelithiasis   Hepatic steatosis   Elevated LFTs   1. Acute pancreatitis The patient is presenting with complaints of abdominal pain. Further workup shows elevated LFT as well as elevated lipase. CT of the abdomen shows hepatic steatosis with possible acute hepatitis, cholelithiasis as well as acute pancreatitis. With this the patient will be admitted in the hospital. I would check Tylenol level alcohol level as well as hepatitis panel. He will remain nothing by mouth. Zofran as needed. Dilaudid as needed for pain. IV Protonix. I would also hydrate him with IV fluids. Gen. surgery has been consulted and we will follow their recommendation.  Advance goals of care discussion: Full code   Consults: Gen. surgery  DVT Prophylaxis: subcutaneous Heparin Nutrition: Nothing by mouth  Disposition: Admitted to inpatient in med-surge unit.  Author: Berle Mull, MD [facility] Pager: [phone] 02/05/2014, 6:27 AM    If 7PM-7AM, please contact night-coverage [email or web address] Password [email or web address]

## 2014-02-05 NOTE — Progress Notes (Signed)
PATIENT DETAILS Name: [name] [name] Age: 43 y.o. Sex: male Date of Birth: [birth year] Admit Date: 02/04/2014 Admitting Physician Berle Mull, MD PCP:No primary care provider on file.  Subjective: No abd pain-1-2 episodes of vomiting this am.  Assessment/Plan: Principal Problem:   Acute Biliary pancreatitis:Continue with supportive care, keep NPO. No abd pain on exam, no CBD dilatation see on CT Abd, suspect we dont need to pursue MRCP at this time, if intra-operative cholangiogram is positive, then we can consult GI post-operatively. Await surgery follow up for timing of Cholecsystectomy  Active Problems:   Elevated Transminitis:secondary to above. Follow.   Disposition: Remain inpatient  Antibiotics:  None   Anti-infectives    None      DVT Prophylaxis: Prophylactic Lovenox   Code Status: Full code or DNR  Family Communication None at bedside-patient awake/alert-understands plan of care  Procedures:  None  CONSULTS:  general surgery  Time spent 40 minutes-which includes 50% of the time with face-to-face with patient/ family and coordinating care related to the above assessment and plan.  MEDICATIONS: Scheduled Meds: . enoxaparin (LOVENOX) injection  40 mg Subcutaneous Q24H  . pantoprazole (PROTONIX) IV  40 mg Intravenous Q24H   Continuous Infusions: . sodium chloride 125 mL/hr at 02/05/14 0547   PRN Meds:.HYDROmorphone (DILAUDID) injection, ondansetron **OR** ondansetron (ZOFRAN) IV    PHYSICAL EXAM: Vital signs in last 24 hours: Filed Vitals:   02/05/14 0530 02/05/14 0545 02/05/14 0613 02/05/14 0957  BP: 141/72 132/88 147/97 111/73  Pulse: 105 105 105 101  Temp:   99.9 F (37.7 C) 98 F (36.7 C)  TempSrc:   Axillary Oral  Resp: 13 17 18 15   Height:   6' (1.829 m)   Weight:   122.698 kg (270 lb 8 oz)   SpO2: 97% 97% 96% 90%    Weight change:  Filed Weights   02/04/14 2309 02/05/14 0613  Weight: 108.863 kg (240 lb) 122.698  kg (270 lb 8 oz)   Body mass index is 36.68 kg/(m^2).   Gen Exam: Awake and alert with clear speech.   Neck: Supple, No JVD.   Chest: B/L Clear.   CVS: S1 S2 Regular, no murmurs.  Abdomen: soft, BS +, non tender, non distended.  Extremities: no edema, lower extremities warm to touch. Neurologic: Non Focal.   Skin: No Rash.   Wounds: N/A.    Intake/Output from previous day:  Intake/Output Summary (Last 24 hours) at 02/05/14 1341 Last data filed at 02/05/14 0700  Gross per 24 hour  Intake   1240 ml  Output      0 ml  Net   1240 ml     LAB RESULTS: CBC  Recent Labs Lab 02/04/14 2323 02/05/14 0532  WBC 7.3 12.3*  HGB 16.3 15.0  HCT 44.9 42.1  PLT 257 241  MCV 87.0 87.7  MCH 31.6 31.3  MCHC 36.3* 35.6  RDW 12.2 12.6  LYMPHSABS 1.3 0.7  MONOABS 0.5 1.2*  EOSABS 0.0 0.0  BASOSABS 0.0 0.0    Chemistries   Recent Labs Lab 02/04/14 2323 02/05/14 0532  NA 139 139  K 4.2 4.4  CL 99 102  CO2 26 22  GLUCOSE 158* 135*  BUN 14 14  CREATININE 1.05 0.98  CALCIUM 9.9 9.0    CBG: No results for input(s): GLUCAP in the last 168 hours.  GFR Estimated Creatinine Clearance: 128.7 mL/min (by C-G formula based on Cr of 0.98).  Coagulation profile  Recent Labs Lab 02/05/14 0532  INR 1.09    Cardiac Enzymes No results for input(s): CKMB, TROPONINI, MYOGLOBIN in the last 168 hours.  Invalid input(s): CK  Invalid input(s): POCBNP No results for input(s): DDIMER in the last 72 hours. No results for input(s): HGBA1C in the last 72 hours. No results for input(s): CHOL, HDL, LDLCALC, TRIG, CHOLHDL, LDLDIRECT in the last 72 hours. No results for input(s): TSH, T4TOTAL, T3FREE, THYROIDAB in the last 72 hours.  Invalid input(s): FREET3 No results for input(s): VITAMINB12, FOLATE, FERRITIN, TIBC, IRON, RETICCTPCT in the last 72 hours.  Recent Labs  02/04/14 2323  LIPASE 1052*    Urine Studies No results for input(s): UHGB, CRYS in the last 72  hours.  Invalid input(s): UACOL, UAPR, USPG, UPH, UTP, UGL, UKET, UBIL, UNIT, UROB, ULEU, UEPI, UWBC, URBC, UBAC, CAST, UCOM, BILUA  MICROBIOLOGY: No results found for this or any previous visit (from the past 240 hour(s)).  RADIOLOGY STUDIES/RESULTS: Ct Abdomen Pelvis W Contrast  02/05/2014   CLINICAL DATA:  Diffuse abdominal pain, sharp stabbing pain beginning this morning. History of abdominal hernia.  EXAM: CT ABDOMEN AND PELVIS WITH CONTRAST  TECHNIQUE: Multidetector CT imaging of the abdomen and pelvis was performed using the standard protocol following bolus administration of intravenous contrast.  CONTRAST:  116mL OMNIPAQUE IOHEXOL 300 MG/ML  SOLN  COMPARISON:  None.  FINDINGS: LUNG BASES: Lingular wedge-like consolidation with ground-glass nodular density. LEFT pleural thickening. Heart size is normal, no pericardial fluid collections.  SOLID ORGANS: The liver is diffusely hypodense most consistent with steatosis. Sub cm cyst in LEFT lobe of the liver. Tiny layering gallstones, extending to the gallbladder neck without gallbladder distension or inflammatory changes. Mild inflammatory changes and trace free fluid about the pancreatic head and duodenum, coronal 92/162. Homogeneous enhancement of the pancreas, no pancreatic duct dilatation. Normal appearance of the spleen and adrenal glands.  GASTROINTESTINAL TRACT: The stomach, small and large bowel are normal in course and caliber without inflammatory changes. Normal appendix.  KIDNEYS/ URINARY TRACT: Kidneys are orthotopic, demonstrating symmetric enhancement. No nephrolithiasis, hydronephrosis or solid renal masses. The unopacified ureters are normal in course and caliber. Delayed imaging through the kidneys demonstrates symmetric prompt contrast excretion within the proximal urinary collecting system. Urinary bladder is partially distended and unremarkable.  PERITONEUM/RETROPERITONEUM: No intraperitoneal free air. Aortoiliac vessels are normal  in course and caliber. No lymphadenopathy by CT size criteria. Internal reproductive organs are unremarkable.  SOFT TISSUE/OSSEOUS STRUCTURES: Ill-defined geographic sclerotic lesion in the LEFT femur metadiaphysis may reflect enchondroma. Nonspecific 8 mm subcentimeter sclerotic lesion LEFT eighth rib could reflect bone island. Severe L5-S1 degenerative disc. Moderate fat containing ventral hernia. Small fat containing umbilical hernia. Small bilateral fat containing inguinal hernias. Rectus abdominis diastases.  IMPRESSION: Mild inflammatory changes about the pancreas, duodenum suggesting pancreatitis without necrosis.  Cholelithiasis, no CT findings of acute cholecystitis.  Markedly hepatic steatosis, superimposed hepatitis not excluded. Lingular atelectasis and pleural thickening, less likely superimposed pneumonia.   Electronically Signed   By: Elon Alas   On: 02/05/2014 02:10    Oren Binet, MD  [facility] Pager:[phone]  If 7PM-7AM, please contact night-coverage [email or web address] Password [email or web address] 02/05/2014, 1:41 PM   LOS: 1 day

## 2014-02-06 LAB — CBC
HCT: 37.9 % — ABNORMAL LOW (ref 39.0–52.0)
Hemoglobin: 12.8 g/dL — ABNORMAL LOW (ref 13.0–17.0)
MCH: 30.3 pg (ref 26.0–34.0)
MCHC: 33.8 g/dL (ref 30.0–36.0)
MCV: 89.8 fL (ref 78.0–100.0)
PLATELETS: 194 10*3/uL (ref 150–400)
RBC: 4.22 MIL/uL (ref 4.22–5.81)
RDW: 13 % (ref 11.5–15.5)
WBC: 12.7 10*3/uL — AB (ref 4.0–10.5)

## 2014-02-06 LAB — COMPREHENSIVE METABOLIC PANEL
ALT: 356 U/L — ABNORMAL HIGH (ref 0–53)
AST: 186 U/L — AB (ref 0–37)
Albumin: 3.2 g/dL — ABNORMAL LOW (ref 3.5–5.2)
Alkaline Phosphatase: 100 U/L (ref 39–117)
Anion gap: 10 (ref 5–15)
BILIRUBIN TOTAL: 1.7 mg/dL — AB (ref 0.3–1.2)
BUN: 16 mg/dL (ref 6–23)
CALCIUM: 8.4 mg/dL (ref 8.4–10.5)
CO2: 27 mEq/L (ref 19–32)
CREATININE: 1.19 mg/dL (ref 0.50–1.35)
Chloride: 101 mEq/L (ref 96–112)
GFR calc non Af Amer: 72 mL/min — ABNORMAL LOW (ref >90)
GFR, EST AFRICAN AMERICAN: 84 mL/min — AB (ref >90)
Glucose, Bld: 105 mg/dL — ABNORMAL HIGH (ref 70–99)
Potassium: 3.8 mEq/L (ref 3.7–5.3)
Sodium: 138 mEq/L (ref 137–147)
Total Protein: 6.6 g/dL (ref 6.0–8.3)

## 2014-02-06 LAB — LIPASE, BLOOD: LIPASE: 142 U/L — AB (ref 11–59)

## 2014-02-06 MED ORDER — ONDANSETRON HCL 4 MG/2ML IJ SOLN
4.0000 mg | Freq: Four times a day (QID) | INTRAMUSCULAR | Status: DC | PRN
  Administered 2014-02-07 – 2014-02-09 (×3): 4 mg via INTRAVENOUS
  Filled 2014-02-06 (×4): qty 2

## 2014-02-06 MED ORDER — ONDANSETRON HCL 4 MG PO TABS
4.0000 mg | ORAL_TABLET | Freq: Four times a day (QID) | ORAL | Status: DC | PRN
  Administered 2014-02-10: 4 mg via ORAL
  Filled 2014-02-06: qty 1

## 2014-02-06 MED ORDER — ACETAMINOPHEN 325 MG PO TABS
650.0000 mg | ORAL_TABLET | Freq: Once | ORAL | Status: AC
  Administered 2014-02-06: 650 mg via ORAL
  Filled 2014-02-06: qty 2

## 2014-02-06 NOTE — Progress Notes (Signed)
PATIENT DETAILS Name: [name] [name] Age: 43 y.o. Sex: male Date of Birth: 1971/02/04 Admit Date: 02/04/2014 Admitting Physician Berle Mull, MD QQV:[id number],IEPPI L., MD  Subjective: Continues to have intermittent abdominal pain-much better than the past few days. One episode of vomiting this morning.  Assessment/Plan: Principal Problem:   Acute Biliary pancreatitis:Continue with supportive care, keep NPO. No abd pain on exam, no CBD dilatation see on CT Abd, suspect we dont need to pursue MRCP at this time, if intra-operative cholangiogram is positive, then we can consult GI post-operatively. Seen by general surgery today, to continue with supportive care to allow pancreatitis to get better before proceeding with cholecystectomy  Active Problems:   Elevated Transminitis:secondary to above. Follow.   Disposition: Remain inpatient  Antibiotics:  None   Anti-infectives    None      DVT Prophylaxis: Prophylactic Lovenox   Code Status: Full code or DNR  Family Communication None at bedside-patient awake/alert-understands plan of care  Procedures:  None  CONSULTS:  general surgery  Time spent 40 minutes-which includes 50% of the time with face-to-face with patient/ family and coordinating care related to the above assessment and plan.  MEDICATIONS: Scheduled Meds: . enoxaparin (LOVENOX) injection  40 mg Subcutaneous Q24H  . pantoprazole (PROTONIX) IV  40 mg Intravenous Q24H   Continuous Infusions: . sodium chloride 125 mL/hr at 02/06/14 0755   PRN Meds:.HYDROmorphone (DILAUDID) injection, ondansetron **OR** ondansetron (ZOFRAN) IV    PHYSICAL EXAM: Vital signs in last 24 hours: Filed Vitals:   02/05/14 1805 02/05/14 2127 02/06/14 0508 02/06/14 1000  BP: 146/94 131/75 140/83 161/89  Pulse: 109 107 110 98  Temp: 98.2 F (36.8 C) 99.6 F (37.6 C) 97.6 F (36.4 C) 98 F (36.7 C)  TempSrc: Oral Oral Oral Oral  Resp: 16 18 18 18   Height:       Weight:      SpO2: 92% 95% 93% 95%    Weight change:  Filed Weights   02/04/14 2309 02/05/14 0613  Weight: 108.863 kg (240 lb) 122.698 kg (270 lb 8 oz)   Body mass index is 36.68 kg/(m^2).   Gen Exam: Awake and alert with clear speech.   Neck: Supple, No JVD.   Chest: B/L Clear.   CVS: S1 S2 Regular, no murmurs.  Abdomen: soft, BS +, mildly tender in the epigastric area, non distended.  Extremities: no edema, lower extremities warm to touch. Neurologic: Non Focal.   Skin: No Rash.   Wounds: N/A.    Intake/Output from previous day:  Intake/Output Summary (Last 24 hours) at 02/06/14 1153 Last data filed at 02/06/14 0934  Gross per 24 hour  Intake 3847.08 ml  Output      0 ml  Net 3847.08 ml     LAB RESULTS: CBC  Recent Labs Lab 02/04/14 2323 02/05/14 0532 02/06/14 0515  WBC 7.3 12.3* 12.7*  HGB 16.3 15.0 12.8*  HCT 44.9 42.1 37.9*  PLT 257 241 194  MCV 87.0 87.7 89.8  MCH 31.6 31.3 30.3  MCHC 36.3* 35.6 33.8  RDW 12.2 12.6 13.0  LYMPHSABS 1.3 0.7  --   MONOABS 0.5 1.2*  --   EOSABS 0.0 0.0  --   BASOSABS 0.0 0.0  --     Chemistries   Recent Labs Lab 02/04/14 2323 02/05/14 0532 02/06/14 0515  NA 139 139 138  K 4.2 4.4 3.8  CL 99 102 101  CO2 26 22 27   GLUCOSE  158* 135* 105*  BUN 14 14 16   CREATININE 1.05 0.98 1.19  CALCIUM 9.9 9.0 8.4    CBG: No results for input(s): GLUCAP in the last 168 hours.  GFR Estimated Creatinine Clearance: 106 mL/min (by C-G formula based on Cr of 1.19).  Coagulation profile  Recent Labs Lab 02/05/14 0532  INR 1.09    Cardiac Enzymes No results for input(s): CKMB, TROPONINI, MYOGLOBIN in the last 168 hours.  Invalid input(s): CK  Invalid input(s): POCBNP No results for input(s): DDIMER in the last 72 hours. No results for input(s): HGBA1C in the last 72 hours. No results for input(s): CHOL, HDL, LDLCALC, TRIG, CHOLHDL, LDLDIRECT in the last 72 hours. No results for input(s): TSH, T4TOTAL, T3FREE,  THYROIDAB in the last 72 hours.  Invalid input(s): FREET3 No results for input(s): VITAMINB12, FOLATE, FERRITIN, TIBC, IRON, RETICCTPCT in the last 72 hours.  Recent Labs  02/04/14 2323 02/06/14 0515  LIPASE 1052* 142*    Urine Studies No results for input(s): UHGB, CRYS in the last 72 hours.  Invalid input(s): UACOL, UAPR, USPG, UPH, UTP, UGL, UKET, UBIL, UNIT, UROB, ULEU, UEPI, UWBC, URBC, UBAC, CAST, UCOM, BILUA  MICROBIOLOGY: No results found for this or any previous visit (from the past 240 hour(s)).  RADIOLOGY STUDIES/RESULTS: Ct Abdomen Pelvis W Contrast  02/05/2014   CLINICAL DATA:  Diffuse abdominal pain, sharp stabbing pain beginning this morning. History of abdominal hernia.  EXAM: CT ABDOMEN AND PELVIS WITH CONTRAST  TECHNIQUE: Multidetector CT imaging of the abdomen and pelvis was performed using the standard protocol following bolus administration of intravenous contrast.  CONTRAST:  135mL OMNIPAQUE IOHEXOL 300 MG/ML  SOLN  COMPARISON:  None.  FINDINGS: LUNG BASES: Lingular wedge-like consolidation with ground-glass nodular density. LEFT pleural thickening. Heart size is normal, no pericardial fluid collections.  SOLID ORGANS: The liver is diffusely hypodense most consistent with steatosis. Sub cm cyst in LEFT lobe of the liver. Tiny layering gallstones, extending to the gallbladder neck without gallbladder distension or inflammatory changes. Mild inflammatory changes and trace free fluid about the pancreatic head and duodenum, coronal 92/162. Homogeneous enhancement of the pancreas, no pancreatic duct dilatation. Normal appearance of the spleen and adrenal glands.  GASTROINTESTINAL TRACT: The stomach, small and large bowel are normal in course and caliber without inflammatory changes. Normal appendix.  KIDNEYS/ URINARY TRACT: Kidneys are orthotopic, demonstrating symmetric enhancement. No nephrolithiasis, hydronephrosis or solid renal masses. The unopacified ureters are normal  in course and caliber. Delayed imaging through the kidneys demonstrates symmetric prompt contrast excretion within the proximal urinary collecting system. Urinary bladder is partially distended and unremarkable.  PERITONEUM/RETROPERITONEUM: No intraperitoneal free air. Aortoiliac vessels are normal in course and caliber. No lymphadenopathy by CT size criteria. Internal reproductive organs are unremarkable.  SOFT TISSUE/OSSEOUS STRUCTURES: Ill-defined geographic sclerotic lesion in the LEFT femur metadiaphysis may reflect enchondroma. Nonspecific 8 mm subcentimeter sclerotic lesion LEFT eighth rib could reflect bone island. Severe L5-S1 degenerative disc. Moderate fat containing ventral hernia. Small fat containing umbilical hernia. Small bilateral fat containing inguinal hernias. Rectus abdominis diastases.  IMPRESSION: Mild inflammatory changes about the pancreas, duodenum suggesting pancreatitis without necrosis.  Cholelithiasis, no CT findings of acute cholecystitis.  Markedly hepatic steatosis, superimposed hepatitis not excluded. Lingular atelectasis and pleural thickening, less likely superimposed pneumonia.   Electronically Signed   By: Elon Alas   On: 02/05/2014 02:10    Oren Binet, MD  [facility] Pager:[phone]  If 7PM-7AM, please contact night-coverage [email or web address] Password [email or web address] 02/06/2014, 11:53 AM  LOS: 2 days

## 2014-02-06 NOTE — Progress Notes (Signed)
Patient ID: [name] [name], male   DOB: [birth year], 43 y.o.   MRN: [id number] [facility] Surgery Progress Note:   * No surgery found *  Subjective: Mental status is clear.  Still hurting quite a bit Objective: Vital signs in last 24 hours: Temp:  [97.6 F (36.4 C)-99.6 F (37.6 C)] 97.6 F (36.4 C) (12/19 0508) Pulse Rate:  [107-110] 110 (12/19 0508) Resp:  [16-18] 18 (12/19 0508) BP: (131-146)/(75-94) 140/83 mmHg (12/19 0508) SpO2:  [92 %-95 %] 93 % (12/19 0508)  Intake/Output from previous day: 12/18 0701 - 12/19 0700 In: 3607.1 [P.O.:480; I.V.:3027.1; IV Piggyback:100] Out: -  Intake/Output this shift:    Physical Exam: Work of breathing is not labored.  Tender in midepigastrium-taking pain meds  Lab Results:  Results for orders placed or performed during the hospital encounter of 02/04/14 (from the past 48 hour(s))  CBC with Differential     Status: Abnormal   Collection Time: 02/04/14 11:23 PM  Result Value Ref Range   WBC 7.3 4.0 - 10.5 K/uL   RBC 5.16 4.22 - 5.81 MIL/uL   Hemoglobin 16.3 13.0 - 17.0 g/dL   HCT 44.9 39.0 - 52.0 %   MCV 87.0 78.0 - 100.0 fL   MCH 31.6 26.0 - 34.0 pg   MCHC 36.3 (H) 30.0 - 36.0 g/dL   RDW 12.2 11.5 - 15.5 %   Platelets 257 150 - 400 K/uL   Neutrophils Relative % 75 43 - 77 %   Neutro Abs 5.5 1.7 - 7.7 K/uL   Lymphocytes Relative 18 12 - 46 %   Lymphs Abs 1.3 0.7 - 4.0 K/uL   Monocytes Relative 7 3 - 12 %   Monocytes Absolute 0.5 0.1 - 1.0 K/uL   Eosinophils Relative 0 0 - 5 %   Eosinophils Absolute 0.0 0.0 - 0.7 K/uL   Basophils Relative 0 0 - 1 %   Basophils Absolute 0.0 0.0 - 0.1 K/uL  Comprehensive metabolic panel     Status: Abnormal   Collection Time: 02/04/14 11:23 PM  Result Value Ref Range   Sodium 139 137 - 147 mEq/L   Potassium 4.2 3.7 - 5.3 mEq/L   Chloride 99 96 - 112 mEq/L   CO2 26 19 - 32 mEq/L   Glucose, Bld 158 (H) 70 - 99 mg/dL   BUN 14 6 - 23 mg/dL   Creatinine, Ser 1.05 0.50 - 1.35 mg/dL    Calcium 9.9 8.4 - 10.5 mg/dL   Total Protein 7.9 6.0 - 8.3 g/dL   Albumin 4.3 3.5 - 5.2 g/dL   AST 525 (H) 0 - 37 U/L   ALT 420 (H) 0 - 53 U/L   Alkaline Phosphatase 118 (H) 39 - 117 U/L   Total Bilirubin 2.6 (H) 0.3 - 1.2 mg/dL   GFR calc non Af Amer 84 (L) >90 mL/min   GFR calc Af Amer >90 >90 mL/min    Comment: (NOTE) The eGFR has been calculated using the CKD EPI equation. This calculation has not been validated in all clinical situations. eGFR's persistently <90 mL/min signify possible Chronic Kidney Disease.    Anion gap 14 5 - 15  Lipase, blood     Status: Abnormal   Collection Time: 02/04/14 11:23 PM  Result Value Ref Range   Lipase 1052 (H) 11 - 59 U/L  I-Stat CG4 Lactic Acid, ED     Status: None   Collection Time: 02/05/14 12:28 AM  Result Value Ref Range  Lactic Acid, Venous 1.30 0.5 - 2.2 mmol/L  CBC WITH DIFFERENTIAL     Status: Abnormal   Collection Time: 02/05/14  5:32 AM  Result Value Ref Range   WBC 12.3 (H) 4.0 - 10.5 K/uL   RBC 4.80 4.22 - 5.81 MIL/uL   Hemoglobin 15.0 13.0 - 17.0 g/dL   HCT 42.1 39.0 - 52.0 %   MCV 87.7 78.0 - 100.0 fL   MCH 31.3 26.0 - 34.0 pg   MCHC 35.6 30.0 - 36.0 g/dL   RDW 12.6 11.5 - 15.5 %   Platelets 241 150 - 400 K/uL   Neutrophils Relative % 85 (H) 43 - 77 %   Neutro Abs 10.5 (H) 1.7 - 7.7 K/uL   Lymphocytes Relative 6 (L) 12 - 46 %   Lymphs Abs 0.7 0.7 - 4.0 K/uL   Monocytes Relative 9 3 - 12 %   Monocytes Absolute 1.2 (H) 0.1 - 1.0 K/uL   Eosinophils Relative 0 0 - 5 %   Eosinophils Absolute 0.0 0.0 - 0.7 K/uL   Basophils Relative 0 0 - 1 %   Basophils Absolute 0.0 0.0 - 0.1 K/uL  Protime-INR     Status: None   Collection Time: 02/05/14  5:32 AM  Result Value Ref Range   Prothrombin Time 14.3 11.6 - 15.2 seconds   INR 1.09 0.00 - 1.49  Comprehensive metabolic panel     Status: Abnormal   Collection Time: 02/05/14  5:32 AM  Result Value Ref Range   Sodium 139 137 - 147 mEq/L   Potassium 4.4 3.7 - 5.3 mEq/L    Chloride 102 96 - 112 mEq/L   CO2 22 19 - 32 mEq/L   Glucose, Bld 135 (H) 70 - 99 mg/dL   BUN 14 6 - 23 mg/dL   Creatinine, Ser 0.98 0.50 - 1.35 mg/dL   Calcium 9.0 8.4 - 10.5 mg/dL   Total Protein 7.3 6.0 - 8.3 g/dL   Albumin 3.9 3.5 - 5.2 g/dL   AST 491 (H) 0 - 37 U/L   ALT 480 (H) 0 - 53 U/L   Alkaline Phosphatase 110 39 - 117 U/L   Total Bilirubin 2.7 (H) 0.3 - 1.2 mg/dL   GFR calc non Af Amer >90 >90 mL/min   GFR calc Af Amer >90 >90 mL/min    Comment: (NOTE) The eGFR has been calculated using the CKD EPI equation. This calculation has not been validated in all clinical situations. eGFR's persistently <90 mL/min signify possible Chronic Kidney Disease.    Anion gap 15 5 - 15  Ethanol     Status: None   Collection Time: 02/05/14  7:10 AM  Result Value Ref Range   Alcohol, Ethyl (B) <11 0 - 11 mg/dL    Comment:        LOWEST DETECTABLE LIMIT FOR SERUM ALCOHOL IS 11 mg/dL FOR MEDICAL PURPOSES ONLY   Acetaminophen level     Status: None   Collection Time: 02/05/14  7:10 AM  Result Value Ref Range   Acetaminophen (Tylenol), Serum <15.0 10 - 30 ug/mL    Comment:        THERAPEUTIC CONCENTRATIONS VARY SIGNIFICANTLY. A RANGE OF 10-30 ug/mL MAY BE AN EFFECTIVE CONCENTRATION FOR MANY PATIENTS. HOWEVER, SOME ARE BEST TREATED AT CONCENTRATIONS OUTSIDE THIS RANGE. ACETAMINOPHEN CONCENTRATIONS >150 ug/mL AT 4 HOURS AFTER INGESTION AND >50 ug/mL AT 12 HOURS AFTER INGESTION ARE OFTEN ASSOCIATED WITH TOXIC REACTIONS.   Hepatitis panel, acute  Status: None   Collection Time: 02/05/14  7:10 AM  Result Value Ref Range   Hepatitis B Surface Ag NEGATIVE NEGATIVE   HCV Ab NEGATIVE NEGATIVE   Hep A IgM NON REACTIVE NON REACTIVE    Comment: (NOTE) Effective January 04, 2014, Hepatitis Acute Panel (test code [phone]) will be revised to automatically reflex to the Hepatitis C Viral RNA, Quantitative, Real-Time PCR assay if the Hepatitis C antibody screening result is Reactive.  This action is being taken to ensure that the CDC/USPSTF recommended HCV diagnostic algorithm with the appropriate test reflex needed for accurate interpretation is followed.    Hep B C IgM NON REACTIVE NON REACTIVE    Comment: (NOTE) High levels of Hepatitis B Core IgM antibody are detectable during the acute stage of Hepatitis B. This antibody is used to differentiate current from past HBV infection. Performed at [facility]   Urine rapid drug screen (hosp performed)     Status: Abnormal   Collection Time: 02/05/14  6:02 PM  Result Value Ref Range   Opiates POSITIVE (A) NONE DETECTED   Cocaine NONE DETECTED NONE DETECTED   Benzodiazepines NONE DETECTED NONE DETECTED   Amphetamines NONE DETECTED NONE DETECTED   Tetrahydrocannabinol NONE DETECTED NONE DETECTED   Barbiturates NONE DETECTED NONE DETECTED    Comment:        DRUG SCREEN FOR MEDICAL PURPOSES ONLY.  IF CONFIRMATION IS NEEDED FOR ANY PURPOSE, NOTIFY LAB WITHIN 5 DAYS.        LOWEST DETECTABLE LIMITS FOR URINE DRUG SCREEN Drug Class       Cutoff (ng/mL) Amphetamine      1000 Barbiturate      200 Benzodiazepine   342 Tricyclics       876 Opiates          300 Cocaine          300 THC              50   Comprehensive metabolic panel     Status: Abnormal   Collection Time: 02/06/14  5:15 AM  Result Value Ref Range   Sodium 138 137 - 147 mEq/L   Potassium 3.8 3.7 - 5.3 mEq/L   Chloride 101 96 - 112 mEq/L   CO2 27 19 - 32 mEq/L   Glucose, Bld 105 (H) 70 - 99 mg/dL   BUN 16 6 - 23 mg/dL   Creatinine, Ser 1.19 0.50 - 1.35 mg/dL   Calcium 8.4 8.4 - 10.5 mg/dL   Total Protein 6.6 6.0 - 8.3 g/dL   Albumin 3.2 (L) 3.5 - 5.2 g/dL   AST 186 (H) 0 - 37 U/L   ALT 356 (H) 0 - 53 U/L   Alkaline Phosphatase 100 39 - 117 U/L   Total Bilirubin 1.7 (H) 0.3 - 1.2 mg/dL   GFR calc non Af Amer 72 (L) >90 mL/min   GFR calc Af Amer 84 (L) >90 mL/min    Comment: (NOTE) The eGFR has been calculated using the CKD EPI  equation. This calculation has not been validated in all clinical situations. eGFR's persistently <90 mL/min signify possible Chronic Kidney Disease.    Anion gap 10 5 - 15  Lipase, blood     Status: Abnormal   Collection Time: 02/06/14  5:15 AM  Result Value Ref Range   Lipase 142 (H) 11 - 59 U/L  CBC     Status: Abnormal   Collection Time: 02/06/14  5:15 AM  Result Value  Ref Range   WBC 12.7 (H) 4.0 - 10.5 K/uL   RBC 4.22 4.22 - 5.81 MIL/uL   Hemoglobin 12.8 (L) 13.0 - 17.0 g/dL   HCT 37.9 (L) 39.0 - 52.0 %   MCV 89.8 78.0 - 100.0 fL   MCH 30.3 26.0 - 34.0 pg   MCHC 33.8 30.0 - 36.0 g/dL   RDW 13.0 11.5 - 15.5 %   Platelets 194 150 - 400 K/uL    Radiology/Results: Ct Abdomen Pelvis W Contrast  02/05/2014   CLINICAL DATA:  Diffuse abdominal pain, sharp stabbing pain beginning this morning. History of abdominal hernia.  EXAM: CT ABDOMEN AND PELVIS WITH CONTRAST  TECHNIQUE: Multidetector CT imaging of the abdomen and pelvis was performed using the standard protocol following bolus administration of intravenous contrast.  CONTRAST:  180m OMNIPAQUE IOHEXOL 300 MG/ML  SOLN  COMPARISON:  None.  FINDINGS: LUNG BASES: Lingular wedge-like consolidation with ground-glass nodular density. LEFT pleural thickening. Heart size is normal, no pericardial fluid collections.  SOLID ORGANS: The liver is diffusely hypodense most consistent with steatosis. Sub cm cyst in LEFT lobe of the liver. Tiny layering gallstones, extending to the gallbladder neck without gallbladder distension or inflammatory changes. Mild inflammatory changes and trace free fluid about the pancreatic head and duodenum, coronal 92/162. Homogeneous enhancement of the pancreas, no pancreatic duct dilatation. Normal appearance of the spleen and adrenal glands.  GASTROINTESTINAL TRACT: The stomach, small and large bowel are normal in course and caliber without inflammatory changes. Normal appendix.  KIDNEYS/ URINARY TRACT: Kidneys are  orthotopic, demonstrating symmetric enhancement. No nephrolithiasis, hydronephrosis or solid renal masses. The unopacified ureters are normal in course and caliber. Delayed imaging through the kidneys demonstrates symmetric prompt contrast excretion within the proximal urinary collecting system. Urinary bladder is partially distended and unremarkable.  PERITONEUM/RETROPERITONEUM: No intraperitoneal free air. Aortoiliac vessels are normal in course and caliber. No lymphadenopathy by CT size criteria. Internal reproductive organs are unremarkable.  SOFT TISSUE/OSSEOUS STRUCTURES: Ill-defined geographic sclerotic lesion in the LEFT femur metadiaphysis may reflect enchondroma. Nonspecific 8 mm subcentimeter sclerotic lesion LEFT eighth rib could reflect bone island. Severe L5-S1 degenerative disc. Moderate fat containing ventral hernia. Small fat containing umbilical hernia. Small bilateral fat containing inguinal hernias. Rectus abdominis diastases.  IMPRESSION: Mild inflammatory changes about the pancreas, duodenum suggesting pancreatitis without necrosis.  Cholelithiasis, no CT findings of acute cholecystitis.  Markedly hepatic steatosis, superimposed hepatitis not excluded. Lingular atelectasis and pleural thickening, less likely superimposed pneumonia.   Electronically Signed   By: CElon Alas  On: 02/05/2014 02:10    Anti-infectives: Anti-infectives    None      Assessment/Plan: Problem List: Patient Active Problem List   Diagnosis Date Noted  . Pancreatitis 02/05/2014  . Acute pancreatitis 02/05/2014  . Cholelithiasis 02/05/2014  . Hepatic steatosis 02/05/2014  . Elevated LFTs 02/05/2014    Lipase is elevated at 146-down from admission.  Would need to cool off more prior to lap chole.   * No surgery found *    LOS: 2 days   Matt B. MHassell Done MD, FThe [facility], [facility] [phone] 02/06/2014 10:19 AM

## 2014-02-07 LAB — COMPREHENSIVE METABOLIC PANEL
ALK PHOS: 115 U/L (ref 39–117)
ALT: 234 U/L — ABNORMAL HIGH (ref 0–53)
AST: 74 U/L — AB (ref 0–37)
Albumin: 3.3 g/dL — ABNORMAL LOW (ref 3.5–5.2)
Anion gap: 10 (ref 5–15)
BUN: 13 mg/dL (ref 6–23)
CALCIUM: 8.7 mg/dL (ref 8.4–10.5)
CHLORIDE: 102 meq/L (ref 96–112)
CO2: 29 mEq/L (ref 19–32)
Creatinine, Ser: 0.98 mg/dL (ref 0.50–1.35)
GFR calc Af Amer: >90 mL/min (ref >90)
GFR calc non Af Amer: >90 mL/min (ref >90)
Glucose, Bld: 99 mg/dL (ref 70–99)
POTASSIUM: 4.1 meq/L (ref 3.7–5.3)
SODIUM: 141 meq/L (ref 137–147)
Total Bilirubin: 1.2 mg/dL (ref 0.3–1.2)
Total Protein: 7.1 g/dL (ref 6.0–8.3)

## 2014-02-07 LAB — CBC
HCT: 38.8 % — ABNORMAL LOW (ref 39.0–52.0)
HEMOGLOBIN: 12.8 g/dL — AB (ref 13.0–17.0)
MCH: 29.9 pg (ref 26.0–34.0)
MCHC: 33 g/dL (ref 30.0–36.0)
MCV: 90.7 fL (ref 78.0–100.0)
PLATELETS: 192 10*3/uL (ref 150–400)
RBC: 4.28 MIL/uL (ref 4.22–5.81)
RDW: 13 % (ref 11.5–15.5)
WBC: 12.5 10*3/uL — AB (ref 4.0–10.5)

## 2014-02-07 LAB — LIPASE, BLOOD: Lipase: 62 U/L — ABNORMAL HIGH (ref 11–59)

## 2014-02-07 MED ORDER — ONDANSETRON HCL 4 MG/2ML IJ SOLN
4.0000 mg | Freq: Four times a day (QID) | INTRAMUSCULAR | Status: DC | PRN
  Administered 2014-02-07: 4 mg via INTRAVENOUS
  Filled 2014-02-07: qty 2

## 2014-02-07 MED ORDER — MORPHINE SULFATE (PF) 1 MG/ML IV SOLN
INTRAVENOUS | Status: DC
  Administered 2014-02-07: 20:00:00 via INTRAVENOUS
  Administered 2014-02-08: 18.55 mg via INTRAVENOUS
  Administered 2014-02-08 (×2): via INTRAVENOUS
  Administered 2014-02-08: 26.98 mg via INTRAVENOUS
  Administered 2014-02-08: 17 mg via INTRAVENOUS
  Administered 2014-02-08: 21:00:00 via INTRAVENOUS
  Administered 2014-02-09: 3 mg via INTRAVENOUS
  Administered 2014-02-09: 6 mg via INTRAVENOUS
  Filled 2014-02-07 (×4): qty 25

## 2014-02-07 MED ORDER — DIPHENHYDRAMINE HCL 12.5 MG/5ML PO ELIX
12.5000 mg | ORAL_SOLUTION | Freq: Four times a day (QID) | ORAL | Status: DC | PRN
  Filled 2014-02-07: qty 5

## 2014-02-07 MED ORDER — NALOXONE HCL 0.4 MG/ML IJ SOLN: 0.4000 mg | INTRAMUSCULAR | Status: DC | PRN

## 2014-02-07 MED ORDER — PANTOPRAZOLE SODIUM 40 MG IV SOLR
40.0000 mg | Freq: Two times a day (BID) | INTRAVENOUS | Status: DC
  Administered 2014-02-07 – 2014-02-09 (×3): 40 mg via INTRAVENOUS
  Filled 2014-02-07 (×5): qty 40

## 2014-02-07 MED ORDER — DIPHENHYDRAMINE HCL 50 MG/ML IJ SOLN: 12.5000 mg | Freq: Four times a day (QID) | INTRAMUSCULAR | Status: DC | PRN

## 2014-02-07 MED ORDER — SODIUM CHLORIDE 0.9 % IJ SOLN: 9.0000 mL | INTRAMUSCULAR | Status: DC | PRN

## 2014-02-07 NOTE — Progress Notes (Signed)
PATIENT DETAILS Name: [name] [name] Age: 43 y.o. Sex: male Date of Birth: [birth year] Admit Date: 02/04/2014 Admitting Physician Berle Mull, MD IOX:[id number],MEQAS L., MD  Subjective: Continues to have intermittent abdominal pain. No other complaints-no vomiting-only nausea  Assessment/Plan: Principal Problem:   Acute Biliary pancreatitis:Continue with supportive care, keep NPO.Mildly tender in the epigastric area today.no CBD dilatation see on CT Abd, suspect we dont need to pursue MRCP at this time, if intra-operative cholangiogram is positive, then we can consult GI post-operatively. Being followedby general surgery, recommendations are to to continue with supportive care to allow pancreatitis to get better before proceeding with cholecystectomy. If has persistent pain still for next few days, may need to begin either TNA or post pyloric feeding.LFT's/Lipase are downtrending, hopefull pancreatitis will continue to cool down as well.  Active Problems:   Elevated Transminitis:secondary to above. Follow.   Disposition: Remain inpatient  Antibiotics:  None   Anti-infectives    None      DVT Prophylaxis: Prophylactic Lovenox   Code Status: Full code  Family Communication None at bedside-patient awake/alert-understands plan of care  Procedures:  None  CONSULTS:  general surgery  MEDICATIONS: Scheduled Meds: . enoxaparin (LOVENOX) injection  40 mg Subcutaneous Q24H  . pantoprazole (PROTONIX) IV  40 mg Intravenous Q24H   Continuous Infusions: . sodium chloride 125 mL/hr at 02/07/14 0939   PRN Meds:.HYDROmorphone (DILAUDID) injection, ondansetron **OR** ondansetron (ZOFRAN) IV    PHYSICAL EXAM: Vital signs in last 24 hours: Filed Vitals:   02/06/14 1900 02/06/14 2027 02/07/14 0535 02/07/14 1033  BP: 135/77 147/87 134/83 158/91  Pulse: 90 105 96 94  Temp: 97.5 F (36.4 C) 98.6 F (37 C) 98.7 F (37.1 C) 98.7 F (37.1 C)  TempSrc:  Oral  Axillary Oral  Resp: 18 18 16 22   Height:      Weight:      SpO2: 96% 94% 92% 91%    Weight change:  Filed Weights   02/04/14 2309 02/05/14 0613  Weight: 108.863 kg (240 lb) 122.698 kg (270 lb 8 oz)   Body mass index is 36.68 kg/(m^2).   Gen Exam: Awake and alert with clear speech.   Neck: Supple, No JVD.   Chest: B/L Clear.   CVS: S1 S2 Regular, no murmurs.  Abdomen: soft, BS +,  tender in the epigastric area, non distended.  Extremities: no edema, lower extremities warm to touch. Neurologic: Non Focal.   Skin: No Rash.   Wounds: N/A.    Intake/Output from previous day:  Intake/Output Summary (Last 24 hours) at 02/07/14 1127 Last data filed at 02/07/14 0900  Gross per 24 hour  Intake   2985 ml  Output      0 ml  Net   2985 ml     LAB RESULTS: CBC  Recent Labs Lab 02/04/14 2323 02/05/14 0532 02/06/14 0515 02/07/14 0437  WBC 7.3 12.3* 12.7* 12.5*  HGB 16.3 15.0 12.8* 12.8*  HCT 44.9 42.1 37.9* 38.8*  PLT 257 241 194 192  MCV 87.0 87.7 89.8 90.7  MCH 31.6 31.3 30.3 29.9  MCHC 36.3* 35.6 33.8 33.0  RDW 12.2 12.6 13.0 13.0  LYMPHSABS 1.3 0.7  --   --   MONOABS 0.5 1.2*  --   --   EOSABS 0.0 0.0  --   --   BASOSABS 0.0 0.0  --   --     Chemistries   Recent Labs Lab 02/04/14 2323 02/05/14 0532  02/06/14 0515 02/07/14 0437  NA 139 139 138 141  K 4.2 4.4 3.8 4.1  CL 99 102 101 102  CO2 26 22 27 29   GLUCOSE 158* 135* 105* 99  BUN 14 14 16 13   CREATININE 1.05 0.98 1.19 0.98  CALCIUM 9.9 9.0 8.4 8.7    CBG: No results for input(s): GLUCAP in the last 168 hours.  GFR Estimated Creatinine Clearance: 128.7 mL/min (by C-G formula based on Cr of 0.98).  Coagulation profile  Recent Labs Lab 02/05/14 0532  INR 1.09    Cardiac Enzymes No results for input(s): CKMB, TROPONINI, MYOGLOBIN in the last 168 hours.  Invalid input(s): CK  Invalid input(s): POCBNP No results for input(s): DDIMER in the last 72 hours. No results for input(s): HGBA1C  in the last 72 hours. No results for input(s): CHOL, HDL, LDLCALC, TRIG, CHOLHDL, LDLDIRECT in the last 72 hours. No results for input(s): TSH, T4TOTAL, T3FREE, THYROIDAB in the last 72 hours.  Invalid input(s): FREET3 No results for input(s): VITAMINB12, FOLATE, FERRITIN, TIBC, IRON, RETICCTPCT in the last 72 hours.  Recent Labs  02/06/14 0515 02/07/14 0437  LIPASE 142* 62*    Urine Studies No results for input(s): UHGB, CRYS in the last 72 hours.  Invalid input(s): UACOL, UAPR, USPG, UPH, UTP, UGL, UKET, UBIL, UNIT, UROB, ULEU, UEPI, UWBC, URBC, UBAC, CAST, UCOM, BILUA  MICROBIOLOGY: No results found for this or any previous visit (from the past 240 hour(s)).  RADIOLOGY STUDIES/RESULTS: Ct Abdomen Pelvis W Contrast  02/05/2014   CLINICAL DATA:  Diffuse abdominal pain, sharp stabbing pain beginning this morning. History of abdominal hernia.  EXAM: CT ABDOMEN AND PELVIS WITH CONTRAST  TECHNIQUE: Multidetector CT imaging of the abdomen and pelvis was performed using the standard protocol following bolus administration of intravenous contrast.  CONTRAST:  126mL OMNIPAQUE IOHEXOL 300 MG/ML  SOLN  COMPARISON:  None.  FINDINGS: LUNG BASES: Lingular wedge-like consolidation with ground-glass nodular density. LEFT pleural thickening. Heart size is normal, no pericardial fluid collections.  SOLID ORGANS: The liver is diffusely hypodense most consistent with steatosis. Sub cm cyst in LEFT lobe of the liver. Tiny layering gallstones, extending to the gallbladder neck without gallbladder distension or inflammatory changes. Mild inflammatory changes and trace free fluid about the pancreatic head and duodenum, coronal 92/162. Homogeneous enhancement of the pancreas, no pancreatic duct dilatation. Normal appearance of the spleen and adrenal glands.  GASTROINTESTINAL TRACT: The stomach, small and large bowel are normal in course and caliber without inflammatory changes. Normal appendix.  KIDNEYS/ URINARY  TRACT: Kidneys are orthotopic, demonstrating symmetric enhancement. No nephrolithiasis, hydronephrosis or solid renal masses. The unopacified ureters are normal in course and caliber. Delayed imaging through the kidneys demonstrates symmetric prompt contrast excretion within the proximal urinary collecting system. Urinary bladder is partially distended and unremarkable.  PERITONEUM/RETROPERITONEUM: No intraperitoneal free air. Aortoiliac vessels are normal in course and caliber. No lymphadenopathy by CT size criteria. Internal reproductive organs are unremarkable.  SOFT TISSUE/OSSEOUS STRUCTURES: Ill-defined geographic sclerotic lesion in the LEFT femur metadiaphysis may reflect enchondroma. Nonspecific 8 mm subcentimeter sclerotic lesion LEFT eighth rib could reflect bone island. Severe L5-S1 degenerative disc. Moderate fat containing ventral hernia. Small fat containing umbilical hernia. Small bilateral fat containing inguinal hernias. Rectus abdominis diastases.  IMPRESSION: Mild inflammatory changes about the pancreas, duodenum suggesting pancreatitis without necrosis.  Cholelithiasis, no CT findings of acute cholecystitis.  Markedly hepatic steatosis, superimposed hepatitis not excluded. Lingular atelectasis and pleural thickening, less likely superimposed pneumonia.   Electronically  Signed   By: Elon Alas   On: 02/05/2014 02:10    Oren Binet, MD  [facility] Pager:[phone]  If 7PM-7AM, please contact night-coverage [email or web address] Password [email or web address] 02/07/2014, 11:27 AM   LOS: 3 days

## 2014-02-07 NOTE — Progress Notes (Signed)
Patient ID: [name] [name], male   DOB: 06/02/1970, 43 y.o.   MRN: [id number] [facility] Progress Note:   * No surgery found *  Subjective: Still having a lot of pain.   Objective: Vital signs in last 24 hours: Temp:  [97.5 F (36.4 C)-98.7 F (37.1 C)] 98.7 F (37.1 C) (12/20 0535) Pulse Rate:  [90-105] 96 (12/20 0535) Resp:  [16-18] 16 (12/20 0535) BP: (134-147)/(77-87) 134/83 mmHg (12/20 0535) SpO2:  [92 %-96 %] 92 % (12/20 0535)  Intake/Output from previous day: 12/19 0701 - 12/20 0700 In: 3235 [P.O.:360; I.V.:2875] Out: -  Intake/Output this shift: Total I/O In: 250 [I.V.:250] Out: -   Physical Exam: Work of breathing is not labored.  Tender in midepigastrium-taking pain meds.  Epigastric hernia palpated   Lab Results:  Results for orders placed or performed during the hospital encounter of 02/04/14 (from the past 48 hour(s))  Urine rapid drug screen (hosp performed)     Status: Abnormal   Collection Time: 02/05/14  6:02 PM  Result Value Ref Range   Opiates POSITIVE (A) NONE DETECTED   Cocaine NONE DETECTED NONE DETECTED   Benzodiazepines NONE DETECTED NONE DETECTED   Amphetamines NONE DETECTED NONE DETECTED   Tetrahydrocannabinol NONE DETECTED NONE DETECTED   Barbiturates NONE DETECTED NONE DETECTED    Comment:        DRUG SCREEN FOR MEDICAL PURPOSES ONLY.  IF CONFIRMATION IS NEEDED FOR ANY PURPOSE, NOTIFY LAB WITHIN 5 DAYS.        LOWEST DETECTABLE LIMITS FOR URINE DRUG SCREEN Drug Class       Cutoff (ng/mL) Amphetamine      1000 Barbiturate      200 Benzodiazepine   200 Tricyclics       300 Opiates          300 Cocaine          300 THC              50   Comprehensive metabolic panel     Status: Abnormal   Collection Time: 02/06/14  5:15 AM  Result Value Ref Range   Sodium 138 137 - 147 mEq/L   Potassium 3.8 3.7 - 5.3 mEq/L   Chloride 101 96 - 112 mEq/L   CO2 27 19 - 32 mEq/L   Glucose, Bld 105 (H) 70 - 99 mg/dL   BUN 16 6 - 23 mg/dL    Creatinine, Ser 1.19 0.50 - 1.35 mg/dL   Calcium 8.4 8.4 - 10.5 mg/dL   Total Protein 6.6 6.0 - 8.3 g/dL   Albumin 3.2 (L) 3.5 - 5.2 g/dL   AST 186 (H) 0 - 37 U/L   ALT 356 (H) 0 - 53 U/L   Alkaline Phosphatase 100 39 - 117 U/L   Total Bilirubin 1.7 (H) 0.3 - 1.2 mg/dL   GFR calc non Af Amer 72 (L) >90 mL/min   GFR calc Af Amer 84 (L) >90 mL/min    Comment: (NOTE) The eGFR has been calculated using the CKD EPI equation. This calculation has not been validated in all clinical situations. eGFR's persistently <90 mL/min signify possible Chronic Kidney Disease.    Anion gap 10 5 - 15  Lipase, blood     Status: Abnormal   Collection Time: 02/06/14  5:15 AM  Result Value Ref Range   Lipase 142 (H) 11 - 59 U/L  CBC     Status: Abnormal   Collection Time: 02/06/14  5:15 AM    Result Value Ref Range   WBC 12.7 (H) 4.0 - 10.5 K/uL   RBC 4.22 4.22 - 5.81 MIL/uL   Hemoglobin 12.8 (L) 13.0 - 17.0 g/dL   HCT 37.9 (L) 39.0 - 52.0 %   MCV 89.8 78.0 - 100.0 fL   MCH 30.3 26.0 - 34.0 pg   MCHC 33.8 30.0 - 36.0 g/dL   RDW 13.0 11.5 - 15.5 %   Platelets 194 150 - 400 K/uL  CBC     Status: Abnormal   Collection Time: 02/07/14  4:37 AM  Result Value Ref Range   WBC 12.5 (H) 4.0 - 10.5 K/uL   RBC 4.28 4.22 - 5.81 MIL/uL   Hemoglobin 12.8 (L) 13.0 - 17.0 g/dL   HCT 38.8 (L) 39.0 - 52.0 %   MCV 90.7 78.0 - 100.0 fL   MCH 29.9 26.0 - 34.0 pg   MCHC 33.0 30.0 - 36.0 g/dL   RDW 13.0 11.5 - 15.5 %   Platelets 192 150 - 400 K/uL  Comprehensive metabolic panel     Status: Abnormal   Collection Time: 02/07/14  4:37 AM  Result Value Ref Range   Sodium 141 137 - 147 mEq/L   Potassium 4.1 3.7 - 5.3 mEq/L   Chloride 102 96 - 112 mEq/L   CO2 29 19 - 32 mEq/L   Glucose, Bld 99 70 - 99 mg/dL   BUN 13 6 - 23 mg/dL   Creatinine, Ser 0.98 0.50 - 1.35 mg/dL   Calcium 8.7 8.4 - 10.5 mg/dL   Total Protein 7.1 6.0 - 8.3 g/dL   Albumin 3.3 (L) 3.5 - 5.2 g/dL   AST 74 (H) 0 - 37 U/L   ALT 234 (H) 0 - 53  U/L   Alkaline Phosphatase 115 39 - 117 U/L   Total Bilirubin 1.2 0.3 - 1.2 mg/dL   GFR calc non Af Amer >90 >90 mL/min   GFR calc Af Amer >90 >90 mL/min    Comment: (NOTE) The eGFR has been calculated using the CKD EPI equation. This calculation has not been validated in all clinical situations. eGFR's persistently <90 mL/min signify possible Chronic Kidney Disease.    Anion gap 10 5 - 15  Lipase, blood     Status: Abnormal   Collection Time: 02/07/14  4:37 AM  Result Value Ref Range   Lipase 62 (H) 11 - 59 U/L    Radiology/Results: No results found.  Anti-infectives: Anti-infectives    None      Assessment/Plan: Problem List: Patient Active Problem List   Diagnosis Date Noted  . Pancreatitis 02/05/2014  . Acute pancreatitis 02/05/2014  . Cholelithiasis 02/05/2014  . Hepatic steatosis 02/05/2014  . Elevated LFTs 02/05/2014    Lipase is back to relatively normal but still having quite a bit of pain and somewhat tender to palpation.  May be ready for surgery tom.   * No surgery found *    LOS: 3 days   Rosario Adie, MD  Colorectal and General Surgery [facility]   02/07/2014 10:11 AM

## 2014-02-08 ENCOUNTER — Encounter (HOSPITAL_COMMUNITY)
Admission: EM | Disposition: A | Discharge status: Home / Self Care | Payer: Self-pay | Source: Home / Self Care | Attending: Internal Medicine

## 2014-02-08 ENCOUNTER — Encounter (HOSPITAL_COMMUNITY): Payer: Self-pay | Admitting: Certified Registered Nurse Anesthetist

## 2014-02-08 ENCOUNTER — Inpatient Hospital Stay (HOSPITAL_COMMUNITY): Payer: 59 | Admitting: Certified Registered Nurse Anesthetist

## 2014-02-08 HISTORY — PX: CHOLECYSTECTOMY: SHX55

## 2014-02-08 LAB — COMPREHENSIVE METABOLIC PANEL
ALT: 149 U/L — AB (ref 0–53)
AST: 35 U/L (ref 0–37)
Albumin: 2.8 g/dL — ABNORMAL LOW (ref 3.5–5.2)
Alkaline Phosphatase: 98 U/L (ref 39–117)
Anion gap: 13 (ref 5–15)
BUN: 9 mg/dL (ref 6–23)
CALCIUM: 8.6 mg/dL (ref 8.4–10.5)
CO2: 24 meq/L (ref 19–32)
Chloride: 102 mEq/L (ref 96–112)
Creatinine, Ser: 0.76 mg/dL (ref 0.50–1.35)
GFR calc Af Amer: >90 mL/min (ref >90)
Glucose, Bld: 104 mg/dL — ABNORMAL HIGH (ref 70–99)
Potassium: 4 mEq/L (ref 3.7–5.3)
SODIUM: 139 meq/L (ref 137–147)
TOTAL PROTEIN: 6.8 g/dL (ref 6.0–8.3)
Total Bilirubin: 0.9 mg/dL (ref 0.3–1.2)

## 2014-02-08 LAB — CBC
HCT: 37.1 % — ABNORMAL LOW (ref 39.0–52.0)
HEMOGLOBIN: 12.5 g/dL — AB (ref 13.0–17.0)
MCH: 30.3 pg (ref 26.0–34.0)
MCHC: 33.7 g/dL (ref 30.0–36.0)
MCV: 90 fL (ref 78.0–100.0)
Platelets: 204 10*3/uL (ref 150–400)
RBC: 4.12 MIL/uL — AB (ref 4.22–5.81)
RDW: 12.7 % (ref 11.5–15.5)
WBC: 10.1 10*3/uL (ref 4.0–10.5)

## 2014-02-08 SURGERY — LAPAROSCOPIC CHOLECYSTECTOMY WITH INTRAOPERATIVE CHOLANGIOGRAM
Anesthesia: General | Site: Abdomen

## 2014-02-08 MED ORDER — NEOSTIGMINE METHYLSULFATE 10 MG/10ML IV SOLN
INTRAVENOUS | Status: AC
  Filled 2014-02-08: qty 1

## 2014-02-08 MED ORDER — PROPOFOL 10 MG/ML IV BOLUS
INTRAVENOUS | Status: AC
  Filled 2014-02-08: qty 20

## 2014-02-08 MED ORDER — SODIUM CHLORIDE 0.9 % IR SOLN
Status: DC | PRN
  Administered 2014-02-08: 1000 mL

## 2014-02-08 MED ORDER — FENTANYL CITRATE 0.05 MG/ML IJ SOLN
INTRAMUSCULAR | Status: AC
  Filled 2014-02-08: qty 5

## 2014-02-08 MED ORDER — ROCURONIUM BROMIDE 100 MG/10ML IV SOLN
INTRAVENOUS | Status: DC | PRN
  Administered 2014-02-08: 50 mg via INTRAVENOUS

## 2014-02-08 MED ORDER — VECURONIUM BROMIDE 10 MG IV SOLR
INTRAVENOUS | Status: AC
  Filled 2014-02-08: qty 10

## 2014-02-08 MED ORDER — BUPIVACAINE-EPINEPHRINE 0.25% -1:200000 IJ SOLN
INTRAMUSCULAR | Status: DC | PRN
  Administered 2014-02-08: 5 mL

## 2014-02-08 MED ORDER — LIDOCAINE HCL (CARDIAC) 20 MG/ML IV SOLN
INTRAVENOUS | Status: AC
  Filled 2014-02-08: qty 5

## 2014-02-08 MED ORDER — POLYETHYLENE GLYCOL 3350 17 G PO PACK
17.0000 g | PACK | Freq: Every day | ORAL | Status: DC
  Administered 2014-02-08 – 2014-02-11 (×4): 17 g via ORAL
  Filled 2014-02-08 (×4): qty 1

## 2014-02-08 MED ORDER — CEFTRIAXONE SODIUM IN DEXTROSE 40 MG/ML IV SOLN
2.0000 g | INTRAVENOUS | Status: AC
  Administered 2014-02-08: 2 g via INTRAVENOUS
  Filled 2014-02-08: qty 50

## 2014-02-08 MED ORDER — NEOSTIGMINE METHYLSULFATE 10 MG/10ML IV SOLN
INTRAVENOUS | Status: DC | PRN
  Administered 2014-02-08: 5 mg via INTRAVENOUS

## 2014-02-08 MED ORDER — HYDROMORPHONE HCL 1 MG/ML IJ SOLN
INTRAMUSCULAR | Status: AC
  Administered 2014-02-08: 0.5 mg via INTRAVENOUS
  Filled 2014-02-08: qty 1

## 2014-02-08 MED ORDER — GLYCOPYRROLATE 0.2 MG/ML IJ SOLN
INTRAMUSCULAR | Status: AC
  Filled 2014-02-08: qty 4

## 2014-02-08 MED ORDER — PROPOFOL 10 MG/ML IV BOLUS
INTRAVENOUS | Status: DC | PRN
  Administered 2014-02-08: 200 mg via INTRAVENOUS

## 2014-02-08 MED ORDER — LACTATED RINGERS IV SOLN
INTRAVENOUS | Status: DC | PRN
  Administered 2014-02-08 (×2): via INTRAVENOUS

## 2014-02-08 MED ORDER — STERILE WATER FOR INJECTION IJ SOLN
INTRAMUSCULAR | Status: AC
  Filled 2014-02-08: qty 10

## 2014-02-08 MED ORDER — PROMETHAZINE HCL 25 MG/ML IJ SOLN: 6.2500 mg | INTRAMUSCULAR | Status: DC | PRN

## 2014-02-08 MED ORDER — GLYCOPYRROLATE 0.2 MG/ML IJ SOLN
INTRAMUSCULAR | Status: DC | PRN
  Administered 2014-02-08: .8 mg via INTRAVENOUS

## 2014-02-08 MED ORDER — VECURONIUM BROMIDE 10 MG IV SOLR
INTRAVENOUS | Status: DC | PRN
  Administered 2014-02-08: 1 mg via INTRAVENOUS
  Administered 2014-02-08: 2 mg via INTRAVENOUS
  Administered 2014-02-08: 1 mg via INTRAVENOUS

## 2014-02-08 MED ORDER — ONDANSETRON HCL 4 MG/2ML IJ SOLN
INTRAMUSCULAR | Status: AC
  Filled 2014-02-08: qty 2

## 2014-02-08 MED ORDER — ENOXAPARIN SODIUM 40 MG/0.4ML ~~LOC~~ SOLN
40.0000 mg | SUBCUTANEOUS | Status: DC
  Administered 2014-02-09 – 2014-02-11 (×3): 40 mg via SUBCUTANEOUS
  Filled 2014-02-08 (×3): qty 0.4

## 2014-02-08 MED ORDER — ONDANSETRON HCL 4 MG/2ML IJ SOLN
INTRAMUSCULAR | Status: DC | PRN
  Administered 2014-02-08: 4 mg via INTRAVENOUS

## 2014-02-08 MED ORDER — HYDROMORPHONE HCL 1 MG/ML IJ SOLN
0.2500 mg | INTRAMUSCULAR | Status: DC | PRN
  Administered 2014-02-08 (×4): 0.5 mg via INTRAVENOUS

## 2014-02-08 MED ORDER — LIDOCAINE HCL (CARDIAC) 20 MG/ML IV SOLN
INTRAVENOUS | Status: DC | PRN
  Administered 2014-02-08: 100 mg via INTRAVENOUS

## 2014-02-08 MED ORDER — 0.9 % SODIUM CHLORIDE (POUR BTL) OPTIME
TOPICAL | Status: DC | PRN
  Administered 2014-02-08 (×3): 1000 mL

## 2014-02-08 MED ORDER — SENNOSIDES-DOCUSATE SODIUM 8.6-50 MG PO TABS
2.0000 | ORAL_TABLET | Freq: Two times a day (BID) | ORAL | Status: DC
  Administered 2014-02-08 – 2014-02-11 (×6): 2 via ORAL
  Filled 2014-02-08 (×7): qty 2

## 2014-02-08 MED ORDER — ROCURONIUM BROMIDE 50 MG/5ML IV SOLN
INTRAVENOUS | Status: AC
  Filled 2014-02-08: qty 1

## 2014-02-08 MED ORDER — MIDAZOLAM HCL 2 MG/2ML IJ SOLN: 0.5000 mg | Freq: Once | INTRAMUSCULAR | Status: DC | PRN

## 2014-02-08 MED ORDER — MIDAZOLAM HCL 2 MG/2ML IJ SOLN
INTRAMUSCULAR | Status: AC
  Filled 2014-02-08: qty 2

## 2014-02-08 MED ORDER — LACTATED RINGERS IV SOLN
INTRAVENOUS | Status: DC
  Administered 2014-02-08: 10:00:00 via INTRAVENOUS

## 2014-02-08 MED ORDER — MEPERIDINE HCL 25 MG/ML IJ SOLN: 6.2500 mg | INTRAMUSCULAR | Status: DC | PRN

## 2014-02-08 MED ORDER — FENTANYL CITRATE 0.05 MG/ML IJ SOLN
INTRAMUSCULAR | Status: DC | PRN
  Administered 2014-02-08: 100 ug via INTRAVENOUS
  Administered 2014-02-08: 50 ug via INTRAVENOUS
  Administered 2014-02-08: 100 ug via INTRAVENOUS
  Administered 2014-02-08: 50 ug via INTRAVENOUS

## 2014-02-08 MED ORDER — SODIUM CHLORIDE 0.9 % IV SOLN: INTRAVENOUS | Status: DC | PRN

## 2014-02-08 SURGICAL SUPPLY — 67 items
APPLIER CLIP ROT 10 11.4 M/L (STAPLE) ×3
BLADE SURG 10 STRL SS (BLADE) ×3 IMPLANT
BLADE SURG ROTATE 9660 (MISCELLANEOUS) ×3 IMPLANT
CANISTER SUCTION 2500CC (MISCELLANEOUS) ×3 IMPLANT
CHLORAPREP W/TINT 26ML (MISCELLANEOUS) ×3 IMPLANT
CLIP APPLIE ROT 10 11.4 M/L (STAPLE) ×1 IMPLANT
COVER MAYO STAND STRL (DRAPES) ×3 IMPLANT
COVER SURGICAL LIGHT HANDLE (MISCELLANEOUS) ×3 IMPLANT
DRAIN CHANNEL 19F RND (DRAIN) ×3 IMPLANT
DRAPE C-ARM 42X72 X-RAY (DRAPES) ×3 IMPLANT
DRAPE LAPAROSCOPIC ABDOMINAL (DRAPES) ×3 IMPLANT
DRAPE WARM FLUID 44X44 (DRAPE) ×3 IMPLANT
DRSG OPSITE POSTOP 4X10 (GAUZE/BANDAGES/DRESSINGS) ×3 IMPLANT
DRSG OPSITE POSTOP 4X6 (GAUZE/BANDAGES/DRESSINGS) ×3 IMPLANT
ELECT BLADE 6.5 EXT (BLADE) ×3 IMPLANT
ELECT CAUTERY BLADE 6.4 (BLADE) ×3 IMPLANT
ELECT REM PT RETURN 9FT ADLT (ELECTROSURGICAL) ×3
ELECTRODE REM PT RTRN 9FT ADLT (ELECTROSURGICAL) ×1 IMPLANT
EVACUATOR SILICONE 100CC (DRAIN) ×3 IMPLANT
GAUZE SPONGE 2X2 8PLY STRL LF (GAUZE/BANDAGES/DRESSINGS) ×1 IMPLANT
GLOVE BIO SURGEON STRL SZ8 (GLOVE) ×6 IMPLANT
GLOVE BIOGEL PI IND STRL 7.0 (GLOVE) ×2 IMPLANT
GLOVE BIOGEL PI IND STRL 7.5 (GLOVE) ×2 IMPLANT
GLOVE BIOGEL PI IND STRL 8 (GLOVE) ×2 IMPLANT
GLOVE BIOGEL PI INDICATOR 7.0 (GLOVE) ×4
GLOVE BIOGEL PI INDICATOR 7.5 (GLOVE) ×4
GLOVE BIOGEL PI INDICATOR 8 (GLOVE) ×4
GLOVE ECLIPSE 6.5 STRL STRAW (GLOVE) ×3 IMPLANT
GLOVE ECLIPSE 7.5 STRL STRAW (GLOVE) ×3 IMPLANT
GLOVE SURG SS PI 7.0 STRL IVOR (GLOVE) ×3 IMPLANT
GOWN STRL REUS W/ TWL LRG LVL3 (GOWN DISPOSABLE) ×4 IMPLANT
GOWN STRL REUS W/ TWL XL LVL3 (GOWN DISPOSABLE) ×1 IMPLANT
GOWN STRL REUS W/TWL LRG LVL3 (GOWN DISPOSABLE) ×8
GOWN STRL REUS W/TWL XL LVL3 (GOWN DISPOSABLE) ×2
HEMOSTAT SNOW SURGICEL 2X4 (HEMOSTASIS) ×6 IMPLANT
KIT BASIN OR (CUSTOM PROCEDURE TRAY) ×3 IMPLANT
KIT ROOM TURNOVER OR (KITS) ×3 IMPLANT
LIQUID BAND (GAUZE/BANDAGES/DRESSINGS) ×3 IMPLANT
NEEDLE HYPO 25GX1X1/2 BEV (NEEDLE) ×3 IMPLANT
NS IRRIG 1000ML POUR BTL (IV SOLUTION) ×6 IMPLANT
PACK GENERAL/GYN (CUSTOM PROCEDURE TRAY) ×3 IMPLANT
PAD ARMBOARD 7.5X6 YLW CONV (MISCELLANEOUS) ×3 IMPLANT
POUCH SPECIMEN RETRIEVAL 10MM (ENDOMECHANICALS) ×3 IMPLANT
SCISSORS LAP 5X35 DISP (ENDOMECHANICALS) ×3 IMPLANT
SET CHOLANGIOGRAPH 5 50 .035 (SET/KITS/TRAYS/PACK) ×3 IMPLANT
SET IRRIG TUBING LAPAROSCOPIC (IRRIGATION / IRRIGATOR) ×3 IMPLANT
SLEEVE ENDOPATH XCEL 5M (ENDOMECHANICALS) ×6 IMPLANT
SOLUTION ANTI FOG 6CC (MISCELLANEOUS) ×3 IMPLANT
SPECIMEN JAR SMALL (MISCELLANEOUS) ×3 IMPLANT
SPONGE GAUZE 2X2 STER 10/PKG (GAUZE/BANDAGES/DRESSINGS) ×2
SPONGE LAP 18X18 X RAY DECT (DISPOSABLE) ×9 IMPLANT
STAPLER VISISTAT 35W (STAPLE) ×3 IMPLANT
SUT ETHILON 2 0 FS 18 (SUTURE) ×3 IMPLANT
SUT MNCRL AB 4-0 PS2 18 (SUTURE) ×3 IMPLANT
SUT PDS AB 1 TP1 54 (SUTURE) ×12 IMPLANT
SUT VIC AB 2-0 SH 18 (SUTURE) ×3 IMPLANT
SUT VICRYL 0 UR6 27IN ABS (SUTURE) ×3 IMPLANT
SYR CONTROL 10ML LL (SYRINGE) ×3 IMPLANT
SYRINGE 20CC LL (MISCELLANEOUS) ×6 IMPLANT
TAPE CLOTH SURG 4X10 WHT LF (GAUZE/BANDAGES/DRESSINGS) ×3 IMPLANT
TOWEL OR 17X24 6PK STRL BLUE (TOWEL DISPOSABLE) ×3 IMPLANT
TOWEL OR 17X26 10 PK STRL BLUE (TOWEL DISPOSABLE) ×3 IMPLANT
TRAY LAPAROSCOPIC (CUSTOM PROCEDURE TRAY) IMPLANT
TROCAR XCEL BLUNT TIP 100MML (ENDOMECHANICALS) ×3 IMPLANT
TROCAR XCEL NON-BLD 11X100MML (ENDOMECHANICALS) ×3 IMPLANT
TROCAR XCEL NON-BLD 5MMX100MML (ENDOMECHANICALS) ×3 IMPLANT
TUBING INSUFFLATION (TUBING) ×3 IMPLANT

## 2014-02-08 NOTE — Progress Notes (Signed)
PATIENT DETAILS Name: [name] [name] Age: 43 y.o. Sex: male Date of Birth: Feb 05, 1971 Admit Date: 02/04/2014 Admitting Physician Berle Mull, MD MWU:[id number],UUVOZ L., MD  Subjective: Feels better today, less Abd pain.  Assessment/Plan: Principal Problem:   Acute Biliary pancreatitis:Admitted and started on supportive measures, kept NPO.Still tender in the epigastric area today.No CBD dilatation see on CT Abd, suspect we dont need to pursue MRCP at this time, if intra-operative cholangiogram is positive, then we can consult GI post-operatively. Being followed by general surgery, for Lap Cholecystectomy today  Active Problems:   Elevated Transminitis:secondary to above. Better-almost normalized.Follow.   Disposition: Remain inpatient  Antibiotics:  None   Anti-infectives    Start     Dose/Rate Route Frequency Ordered Stop   02/08/14 0845  [MAR Hold]  cefTRIAXone (ROCEPHIN) 2 g in dextrose 5 % 50 mL IVPB - Premix     (MAR Hold since 02/08/14 0959)   2 g100 mL/hr over 30 Minutes Intravenous On call to O.R. 02/08/14 0836 02/09/14 0559      DVT Prophylaxis: Prophylactic Lovenox   Code Status: Full code  Family Communication None at bedside-patient awake/alert-understands plan of care  Procedures:  None  CONSULTS:  general surgery  MEDICATIONS: Scheduled Meds: . [MAR Hold] cefTRIAXone (ROCEPHIN)  IV  2 g Intravenous On Call to OR  . [MAR Hold] enoxaparin (LOVENOX) injection  40 mg Subcutaneous Q24H  . [MAR Hold] morphine   Intravenous 6 times per day  . [MAR Hold] pantoprazole (PROTONIX) IV  40 mg Intravenous Q12H   Continuous Infusions: . sodium chloride 125 mL/hr at 02/08/14 0134  . lactated ringers 10 mL/hr at 02/08/14 1012   PRN Meds:.[MAR Hold] diphenhydrAMINE **OR** [MAR Hold] diphenhydrAMINE, [MAR Hold] naloxone **AND** [MAR Hold] sodium chloride, [MAR Hold] ondansetron **OR** [MAR Hold] ondansetron (ZOFRAN) IV, [MAR Hold] ondansetron (ZOFRAN)  IV    PHYSICAL EXAM: Vital signs in last 24 hours: Filed Vitals:   02/08/14 0133 02/08/14 0612 02/08/14 0730 02/08/14 0945  BP:  133/94  147/92  Pulse:  91  97  Temp:  99.2 F (37.3 C)  98 F (36.7 C)  TempSrc:  Oral  Oral  Resp: 13 16 12 20   Height:      Weight:      SpO2: 94% 95% 94% 95%    Weight change:  Filed Weights   02/04/14 2309 02/05/14 0613 02/07/14 2136  Weight: 108.863 kg (240 lb) 122.698 kg (270 lb 8 oz) 124.2 kg (273 lb 13 oz)   Body mass index is 37.13 kg/(m^2).   Gen Exam: Awake and alert with clear speech.   Neck: Supple, No JVD.   Chest: B/L Clear. No rales or rhochi  CVS: S1 S2 Regular, no murmurs.  Abdomen: soft, BS +,  Still with some tenderness in the epigastric area, non distended.  Extremities: no edema, lower extremities warm to touch. Neurologic: Non Focal.   Skin: No Rash.   Wounds: N/A.    Intake/Output from previous day:  Intake/Output Summary (Last 24 hours) at 02/08/14 1044 Last data filed at 02/08/14 0900  Gross per 24 hour  Intake   1560 ml  Output      0 ml  Net   1560 ml     LAB RESULTS: CBC  Recent Labs Lab 02/04/14 2323 02/05/14 0532 02/06/14 0515 02/07/14 0437 02/08/14 0501  WBC 7.3 12.3* 12.7* 12.5* 10.1  HGB 16.3 15.0 12.8* 12.8* 12.5*  HCT 44.9 42.1  37.9* 38.8* 37.1*  PLT 257 241 194 192 204  MCV 87.0 87.7 89.8 90.7 90.0  MCH 31.6 31.3 30.3 29.9 30.3  MCHC 36.3* 35.6 33.8 33.0 33.7  RDW 12.2 12.6 13.0 13.0 12.7  LYMPHSABS 1.3 0.7  --   --   --   MONOABS 0.5 1.2*  --   --   --   EOSABS 0.0 0.0  --   --   --   BASOSABS 0.0 0.0  --   --   --     Chemistries   Recent Labs Lab 02/04/14 2323 02/05/14 0532 02/06/14 0515 02/07/14 0437 02/08/14 0501  NA 139 139 138 141 139  K 4.2 4.4 3.8 4.1 4.0  CL 99 102 101 102 102  CO2 26 22 27 29 24   GLUCOSE 158* 135* 105* 99 104*  BUN 14 14 16 13 9   CREATININE 1.05 0.98 1.19 0.98 0.76  CALCIUM 9.9 9.0 8.4 8.7 8.6    CBG: No results for input(s): GLUCAP  in the last 168 hours.  GFR Estimated Creatinine Clearance: 158.7 mL/min (by C-G formula based on Cr of 0.76).  Coagulation profile  Recent Labs Lab 02/05/14 0532  INR 1.09    Cardiac Enzymes No results for input(s): CKMB, TROPONINI, MYOGLOBIN in the last 168 hours.  Invalid input(s): CK  Invalid input(s): POCBNP No results for input(s): DDIMER in the last 72 hours. No results for input(s): HGBA1C in the last 72 hours. No results for input(s): CHOL, HDL, LDLCALC, TRIG, CHOLHDL, LDLDIRECT in the last 72 hours. No results for input(s): TSH, T4TOTAL, T3FREE, THYROIDAB in the last 72 hours.  Invalid input(s): FREET3 No results for input(s): VITAMINB12, FOLATE, FERRITIN, TIBC, IRON, RETICCTPCT in the last 72 hours.  Recent Labs  02/06/14 0515 02/07/14 0437  LIPASE 142* 62*    Urine Studies No results for input(s): UHGB, CRYS in the last 72 hours.  Invalid input(s): UACOL, UAPR, USPG, UPH, UTP, UGL, UKET, UBIL, UNIT, UROB, ULEU, UEPI, UWBC, URBC, UBAC, CAST, UCOM, BILUA  MICROBIOLOGY: No results found for this or any previous visit (from the past 240 hour(s)).  RADIOLOGY STUDIES/RESULTS: Ct Abdomen Pelvis W Contrast  02/05/2014   CLINICAL DATA:  Diffuse abdominal pain, sharp stabbing pain beginning this morning. History of abdominal hernia.  EXAM: CT ABDOMEN AND PELVIS WITH CONTRAST  TECHNIQUE: Multidetector CT imaging of the abdomen and pelvis was performed using the standard protocol following bolus administration of intravenous contrast.  CONTRAST:  153mL OMNIPAQUE IOHEXOL 300 MG/ML  SOLN  COMPARISON:  None.  FINDINGS: LUNG BASES: Lingular wedge-like consolidation with ground-glass nodular density. LEFT pleural thickening. Heart size is normal, no pericardial fluid collections.  SOLID ORGANS: The liver is diffusely hypodense most consistent with steatosis. Sub cm cyst in LEFT lobe of the liver. Tiny layering gallstones, extending to the gallbladder neck without gallbladder  distension or inflammatory changes. Mild inflammatory changes and trace free fluid about the pancreatic head and duodenum, coronal 92/162. Homogeneous enhancement of the pancreas, no pancreatic duct dilatation. Normal appearance of the spleen and adrenal glands.  GASTROINTESTINAL TRACT: The stomach, small and large bowel are normal in course and caliber without inflammatory changes. Normal appendix.  KIDNEYS/ URINARY TRACT: Kidneys are orthotopic, demonstrating symmetric enhancement. No nephrolithiasis, hydronephrosis or solid renal masses. The unopacified ureters are normal in course and caliber. Delayed imaging through the kidneys demonstrates symmetric prompt contrast excretion within the proximal urinary collecting system. Urinary bladder is partially distended and unremarkable.  PERITONEUM/RETROPERITONEUM: No intraperitoneal free  air. Aortoiliac vessels are normal in course and caliber. No lymphadenopathy by CT size criteria. Internal reproductive organs are unremarkable.  SOFT TISSUE/OSSEOUS STRUCTURES: Ill-defined geographic sclerotic lesion in the LEFT femur metadiaphysis may reflect enchondroma. Nonspecific 8 mm subcentimeter sclerotic lesion LEFT eighth rib could reflect bone island. Severe L5-S1 degenerative disc. Moderate fat containing ventral hernia. Small fat containing umbilical hernia. Small bilateral fat containing inguinal hernias. Rectus abdominis diastases.  IMPRESSION: Mild inflammatory changes about the pancreas, duodenum suggesting pancreatitis without necrosis.  Cholelithiasis, no CT findings of acute cholecystitis.  Markedly hepatic steatosis, superimposed hepatitis not excluded. Lingular atelectasis and pleural thickening, less likely superimposed pneumonia.   Electronically Signed   By: Elon Alas   On: 02/05/2014 02:10    Oren Binet, MD  [facility] Pager:[phone]  If 7PM-7AM, please contact night-coverage [email or web address] Password [email or web address] 02/08/2014, 10:44  AM   LOS: 4 days

## 2014-02-08 NOTE — H&P (View-Only) (Signed)
Patient ID: [name] [name], male   DOB: 05/02/1970, 43 y.o.   MRN: [id number]    Subjective: Pt put on a PCA last night.  C/o RUQ abdominal pain this am  Objective: Vital signs in last 24 hours: Temp:  [98.5 F (36.9 C)-99.8 F (37.7 C)] 99.2 F (37.3 C) (12/21 0612) Pulse Rate:  [91-96] 91 (12/21 0612) Resp:  [12-22] 12 (12/21 0730) BP: (133-158)/(80-94) 133/94 mmHg (12/21 0612) SpO2:  [89 %-98 %] 94 % (12/21 0730) FiO2 (%):  [94 %-97 %] 94 % (12/21 0730) Weight:  [273 lb 13 oz (124.2 kg)] 273 lb 13 oz (124.2 kg) (12/20 2136) Last BM Date: 02/03/14  Intake/Output from previous day: 12/20 0701 - 12/21 0700 In: 1810 [P.O.:60; I.V.:1750] Out: -  Intake/Output this shift:    PE: Abd: soft, obese, tender mostly in the RUQ, no real epigastric discomfort.  VH noted.  Soft umbilical hernia noted as well.  Lab Results:   Recent Labs  02/07/14 0437 02/08/14 0501  WBC 12.5* 10.1  HGB 12.8* 12.5*  HCT 38.8* 37.1*  PLT 192 204   BMET  Recent Labs  02/07/14 0437 02/08/14 0501  NA 141 139  K 4.1 4.0  CL 102 102  CO2 29 24  GLUCOSE 99 104*  BUN 13 9  CREATININE 0.98 0.76  CALCIUM 8.7 8.6   PT/INR No results for input(s): LABPROT, INR in the last 72 hours. CMP     Component Value Date/Time   NA 139 02/08/2014 0501   K 4.0 02/08/2014 0501   CL 102 02/08/2014 0501   CO2 24 02/08/2014 0501   GLUCOSE 104* 02/08/2014 0501   BUN 9 02/08/2014 0501   CREATININE 0.76 02/08/2014 0501   CALCIUM 8.6 02/08/2014 0501   PROT 6.8 02/08/2014 0501   ALBUMIN 2.8* 02/08/2014 0501   AST 35 02/08/2014 0501   ALT 149* 02/08/2014 0501   ALKPHOS 98 02/08/2014 0501   BILITOT 0.9 02/08/2014 0501   GFRNONAA >90 02/08/2014 0501   GFRAA >90 02/08/2014 0501   Lipase     Component Value Date/Time   LIPASE 62* 02/07/2014 0437       Studies/Results: No results found.  Anti-infectives: Anti-infectives    Start     Dose/Rate Route Frequency Ordered Stop   02/08/14 0845   cefTRIAXone (ROCEPHIN) 2 g in dextrose 5 % 50 mL IVPB - Premix     2 g100 mL/hr over 30 Minutes Intravenous On call to O.R. 02/08/14 9233 02/09/14 0559       Assessment/Plan  1. Gallstone pancreatitis 2. Ventral hernia 3. Umbilical hernia 4. Obese  Plan: 1. D/W Dr. Brantley Stage.  We will take the patient to the OR today for a lap chole.  Concerned with the RUQ pain, he may have cholecystitis and with a normal lipase level, we do not want to wait any longer before removing his gallbladder. 2. Patient is agreeable.  Cont NPO and add rocephin on call to OR.   We will not repair his ventral hernia, but his umbilical hernia will likely get primarily repaired during surgery simply due to its location.  LOS: 4 days    Sheryle Vice E 02/08/2014, 8:36 AM Pager: [phone]

## 2014-02-08 NOTE — Transfer of Care (Signed)
Immediate Anesthesia Transfer of Care Note  Patient: [name] [name]  Procedure(s) Performed: Procedure(s): ATTEMPTED LAPAROSCOPIC CHOLECYSTECTOMY (N/A) CHOLECYSTECTOMY (N/A)  Patient Location: PACU  Anesthesia Type:General  Level of Consciousness: lethargic  Airway & Oxygen Therapy: Patient Spontanous Breathing and Patient connected to nasal cannula oxygen  Post-op Assessment: Report given to PACU RN, Post -op Vital signs reviewed and stable, Patient moving all extremities and baseline tachycardia 107bpm  Post vital signs: Reviewed and stable  Complications: No apparent anesthesia complications

## 2014-02-08 NOTE — Progress Notes (Addendum)
Wasted 2.5 mg PCA Morphine in sink. This was witnessed by Earleen Reaper RN. Will continue to monitor patient.  Penni Bombard, RN 8:10 AM 02/08/2014

## 2014-02-08 NOTE — Anesthesia Postprocedure Evaluation (Signed)
  Anesthesia Post-op Note  Patient: [name] [name]  Procedure(s) Performed: Procedure(s): ATTEMPTED LAPAROSCOPIC CHOLECYSTECTOMY (N/A) CHOLECYSTECTOMY (N/A)  Patient Location: PACU  Anesthesia Type:General  Level of Consciousness: awake, alert , oriented and patient cooperative  Airway and Oxygen Therapy: Patient Spontanous Breathing and Patient connected to nasal cannula oxygen  Post-op Pain: mild  Post-op Assessment: Post-op Vital signs reviewed, Patient's Cardiovascular Status Stable, Respiratory Function Stable, Patent Airway, No signs of Nausea or vomiting and Pain level controlled  Post-op Vital Signs: Reviewed and stable  Last Vitals:  Filed Vitals:   02/08/14 1435  BP: 144/89  Pulse: 115  Temp:   Resp: 17    Complications: No apparent anesthesia complications

## 2014-02-08 NOTE — Anesthesia Procedure Notes (Signed)
Procedure Name: Intubation Date/Time: 02/08/2014 10:48 AM Performed by: Shirlyn Goltz Pre-anesthesia Checklist: Patient identified, Emergency Drugs available, Suction available and Patient being monitored Patient Re-evaluated:Patient Re-evaluated prior to inductionOxygen Delivery Method: Circle system utilized Preoxygenation: Pre-oxygenation with 100% oxygen Intubation Type: IV induction Ventilation: Mask ventilation without difficulty and Two handed mask ventilation required Laryngoscope Size: Miller and 3 Grade View: Grade II Tube type: Oral Tube size: 7.5 mm Number of attempts: 1 Airway Equipment and Method: Stylet Placement Confirmation: ETT inserted through vocal cords under direct vision,  positive ETCO2 and breath sounds checked- equal and bilateral Secured at: 21 cm Tube secured with: Tape Dental Injury: Teeth and Oropharynx as per pre-operative assessment

## 2014-02-08 NOTE — Progress Notes (Signed)
Patient ID: [name] [name], male   DOB: 01/29/1971, 43 y.o.   MRN: [id number]    Subjective: Pt put on a PCA last night.  C/o RUQ abdominal pain this am  Objective: Vital signs in last 24 hours: Temp:  [98.5 F (36.9 C)-99.8 F (37.7 C)] 99.2 F (37.3 C) (12/21 0612) Pulse Rate:  [91-96] 91 (12/21 0612) Resp:  [12-22] 12 (12/21 0730) BP: (133-158)/(80-94) 133/94 mmHg (12/21 0612) SpO2:  [89 %-98 %] 94 % (12/21 0730) FiO2 (%):  [94 %-97 %] 94 % (12/21 0730) Weight:  [273 lb 13 oz (124.2 kg)] 273 lb 13 oz (124.2 kg) (12/20 2136) Last BM Date: 02/03/14  Intake/Output from previous day: 12/20 0701 - 12/21 0700 In: 1810 [P.O.:60; I.V.:1750] Out: -  Intake/Output this shift:    PE: Abd: soft, obese, tender mostly in the RUQ, no real epigastric discomfort.  VH noted.  Soft umbilical hernia noted as well.  Lab Results:   Recent Labs  02/07/14 0437 02/08/14 0501  WBC 12.5* 10.1  HGB 12.8* 12.5*  HCT 38.8* 37.1*  PLT 192 204   BMET  Recent Labs  02/07/14 0437 02/08/14 0501  NA 141 139  K 4.1 4.0  CL 102 102  CO2 29 24  GLUCOSE 99 104*  BUN 13 9  CREATININE 0.98 0.76  CALCIUM 8.7 8.6   PT/INR No results for input(s): LABPROT, INR in the last 72 hours. CMP     Component Value Date/Time   NA 139 02/08/2014 0501   K 4.0 02/08/2014 0501   CL 102 02/08/2014 0501   CO2 24 02/08/2014 0501   GLUCOSE 104* 02/08/2014 0501   BUN 9 02/08/2014 0501   CREATININE 0.76 02/08/2014 0501   CALCIUM 8.6 02/08/2014 0501   PROT 6.8 02/08/2014 0501   ALBUMIN 2.8* 02/08/2014 0501   AST 35 02/08/2014 0501   ALT 149* 02/08/2014 0501   ALKPHOS 98 02/08/2014 0501   BILITOT 0.9 02/08/2014 0501   GFRNONAA >90 02/08/2014 0501   GFRAA >90 02/08/2014 0501   Lipase     Component Value Date/Time   LIPASE 62* 02/07/2014 0437       Studies/Results: No results found.  Anti-infectives: Anti-infectives    Start     Dose/Rate Route Frequency Ordered Stop   02/08/14 0845   cefTRIAXone (ROCEPHIN) 2 g in dextrose 5 % 50 mL IVPB - Premix     2 g100 mL/hr over 30 Minutes Intravenous On call to O.R. 02/08/14 2458 02/09/14 0559       Assessment/Plan  1. Gallstone pancreatitis 2. Ventral hernia 3. Umbilical hernia 4. Obese  Plan: 1. D/W Dr. Brantley Stage.  We will take the patient to the OR today for a lap chole.  Concerned with the RUQ pain, he may have cholecystitis and with a normal lipase level, we do not want to wait any longer before removing his gallbladder. 2. Patient is agreeable.  Cont NPO and add rocephin on call to OR.   We will not repair his ventral hernia, but his umbilical hernia will likely get primarily repaired during surgery simply due to its location.  LOS: 4 days    [name] [name] E 02/08/2014, 8:36 AM Pager: [phone]

## 2014-02-08 NOTE — Op Note (Signed)
[name], [name]            ACCOUNT NO.:  [id number]  MEDICAL RECORD NO.:  [id number]  LOCATION:  [id number]                        FACILITY:  [facility]  PHYSICIAN:  [name] A. Jeremaine Maraj, M.D.DATE OF BIRTH:  04-Aug-1970  DATE OF PROCEDURE:  02/08/2014 DATE OF DISCHARGE:                              OPERATIVE REPORT   PREOPERATIVE DIAGNOSIS:  Gallstone pancreatitis.  POSTOPERATIVE DIAGNOSES: 1. Gallstone pancreatitis. 2. Acute necrotizing cholecystitis.  PROCEDURE: 1. Attempted laparoscopic cholecystectomy. 2. Open cholecystectomy.  SURGEON:  [name] A. Temica Righetti, M.D.  ANESTHESIA:  General endotracheal anesthesia.  EBL:  200 mL.  IV FLUIDS:  1000 mL of crystalloid.  DRAINS: 1. A 19 round drain. 2. Gallbladder fossa.  INDICATIONS FOR PROCEDURE:  The patient is a 43 year old male, admitted 3 days ago to the medical Service.  He had gallstone pancreatitis. His pancreatitis seemed to resolve by exam and laboratory studies.  He is developing more severe right upper quadrant pain overnight requiring a PCA.  He is tender medicated, and we felt that cholecystectomy is warranted since I had concerns he had now developed acute cholecystitis. We discussed all this with the patient and his wife.  We discussed both laparoscopic and the need to convert to an open procedure.  We discussed at length of recovery from all of the above.  I discussed potential risks of surgery for either open or laparoscopic to be inclusive but not exclusive of bleeding, infection, common bile duct injury, organ injury, bowel injury, the need to do other procedures, death, DVT, exacerbation of underlying medical problems, and the need for other operations.  He also had a ventral hernia, and I thought we could fix at a later time but may need to be dealt with intraoperatively.  He had small umbilical hernia that we could use for access to the abdominal cavity.  After discussion of all the above procedures as well  as potential need for conversion to open procedure, he wished to proceed.  DESCRIPTION OF PROCEDURE:  The patient was met in the holding area and questions were answered.  He was then taken back to the operating room and placed supine on the OR table.  After induction of general endotracheal anesthesia, the abdomen was prepped and draped in sterile fashion.  Time-out was done.  He was already on preoperative antibiotics.  A 1 cm incision was made superior to his umbilicus through the umbilicus to enter a small umbilical hernia which we did easily.  I enlarged the fascial defect with a scalpel blade and a put pursestring suture of 0 Vicryl there.  A 12 mm Hasson cannula was placed through this.  A pneumoperitoneum was created to 15 mmHg of CO2.  Laparoscope was placed.  He was morbidly obese, hence we put him in a much reversed Trendelenburg as we could.  Upon insertion of the scope, he had a fatty liver.  He had omentum in a ventral hernia but no colon.  I then placed two 5 mm ports under direct vision in the right upper quadrant.  I then used graspers to pull the omentum out of the incarcerated hernia, which was a small ventral hernia.  There was no bowel in this.  We were able  to do this and reduce this.  We still had a very difficult time due to his large body habitus visualizing the gallbladder, and he had steatosis of his liver, which made him very difficult to see.  I placed my 11 mm subxiphoid port under direct vision.  Upon examination of the gallbladder, he had acute necrotizing cholecystitis now.  We grabbed the dome and the tissue was so friable as the gallbladder ripped very easily.  I then tried to place an additional 5 mm port in the patient's left mid abdomen to place retractor, but I could not manipulate the gallbladder all since the tissues were paper like in tearing with the graspers.  I felt at this point in time conversion to an open procedure necessary.  We passed  off laparoscopic instruments.  A right subcostal incision was made and dissection was carried down to the abdominal wall muscle layers until we entered the abdominal cavity.  I then placed a retracting system in place to retract out the transverse colon omentum. I was able to identify the gallbladder and it was quite friable and severely inflamed.  I then began to mobilize the gallbladder from the gallbladder fossa using cautery in a dome down approach.  We continued this all the way down to the cystic artery was identified and divided between clips.  The infundibulum of the gallbladder was extremely friable,  and we tried to manipulate this, the gallbladder separated from the infundibulum.  I was able to pass the gallbladder off the field and out of the field.  The infundibulum was then visualized and extremely friable, but I could see the orifice of the cystic duct.  I went ahead and over sewed this since I did not feel an attempt at cholangiogram would be prudent given the poor tissue quality and concern for tearing the cystic duct.  I over sewed the orifice of the cystic duct with 2-0 Vicryl.  Irrigation was used and suctioned out.  There was some bleeding from the second portion of duodenum that we over sewed with 2-0 Vicryl.  Irrigation was used and suctioned until clear.  There was no evidence of any bile leakage.  Gallbladder bed was hemostatic. Surgicel SNoW was placed in the gallbladder bed as well.  Through the previous placed lateral port site, I placed a 19 round drain in the gallbladder fossa and secured to the skin with 2-0 nylon.  All packs were removed and counted and found to be correct.  The omentum was carefully examined where it was incarcerated in the ventral hernia and appeared intact with no signs of bleeding from the omentum.  The transverse colon was carefully examined as well.  No evidence of injury was noted.  We then closed the abdominal wall layers using #1  PDS in a running fashion to close the posterior and anterior sheath layers of the abdominal wall.  The umbilical port site was closed with the previously placed pursestring suture.  Staples were used to close the skin after irrigation of subcutaneous fat.  Drain was placed to bulb suction.  Dry dressings were applied.  All port sites were stapled.  All final counts of sponge, needle, and instruments were found to be correct at this portion case.  The patient was then awoken, extubated, and taken to recovery in stable condition.     Christohper Dube A. Chayne Baumgart, M.D.     TAC/MEDQ  D:  02/08/2014  T:  02/08/2014  Job:  [id number]

## 2014-02-08 NOTE — Anesthesia Preprocedure Evaluation (Addendum)
Anesthesia Evaluation    Airway Mallampati: II       Dental   Pulmonary          Cardiovascular     Neuro/Psych    GI/Hepatic Elevated LFTs with acute cholecystitis/pancreatitis Cholelithiasis with acute cholecystitis and pancreatitis   Endo/Other  Morbid obesity  Renal/GU negative Renal ROS     Musculoskeletal   Abdominal   Peds  Hematology   Anesthesia Other Findings   Reproductive/Obstetrics                            Anesthesia Physical Anesthesia Plan  ASA: II  Anesthesia Plan:    Post-op Pain Management:    Induction:   Airway Management Planned:   Additional Equipment:   Intra-op Plan:   Post-operative Plan:   Informed Consent:   Plan Discussed with:   Anesthesia Plan Comments:         Anesthesia Quick Evaluation

## 2014-02-08 NOTE — Interval H&P Note (Signed)
History and Physical Interval Note:  02/08/2014 10:26 AM  [name] [name]  has presented today for surgery, with the diagnosis of Gallstones  The various methods of treatment have been discussed with the patient and family. After consideration of risks, benefits and other options for treatment, the patient has consented to  Procedure(s): LAPAROSCOPIC CHOLECYSTECTOMY WITH INTRAOPERATIVE CHOLANGIOGRAM (N/A) as a surgical intervention .  The patient's history has been reviewed, patient examined, no change in status, stable for surgery.  I have reviewed the patient's chart and labs.  Questions were answered to the patient's satisfaction.     Karron Alvizo A.

## 2014-02-08 NOTE — Progress Notes (Signed)
Wasted 1 mg PCA Morphine in sink.  This was witnessed by Percell Boston RN.  Will continue to monitor patient.  [employer] RN-BC, WTA.

## 2014-02-08 NOTE — Brief Op Note (Signed)
02/04/2014 - 02/08/2014  12:54 PM  PATIENT:  [name] [name]  43 y.o. male  PRE-OPERATIVE DIAGNOSIS:  Gallstones  POST-OPERATIVE DIAGNOSIS:  Gallstones  PROCEDURE:  laparoscopic converted to open cholecystectomy  SURGEON:  Surgeon(s) and Role:    * [name], MD - Primary    ASSISTANTS: Hitchcock RNFA   ANESTHESIA:   general  EBL:  Total I/O In: 1000 [I.V.:1000] Out: 200 [Blood:200]  BLOOD ADMINISTERED:none  DRAINS: 19 ROUND DRAIN TO GALLBLADDER FOSSA   LOCAL MEDICATIONS USED:  BUPIVICAINE   SPECIMEN:  Source of Specimen:  GALLBLADDER  DISPOSITION OF SPECIMEN:  PATHOLOGY  COUNTS:  YES  TOURNIQUET:  * No tourniquets in log *  DICTATION: .Other Dictation: Dictation Number  [id number]  PLAN OF CARE: Admit to inpatient   PATIENT DISPOSITION:  PACU - hemodynamically stable.   Delay start of Pharmacological VTE agent (>24hrs) due to surgical blood loss or risk of bleeding: no

## 2014-02-09 ENCOUNTER — Encounter (HOSPITAL_COMMUNITY): Payer: Self-pay | Admitting: Surgery

## 2014-02-09 LAB — COMPREHENSIVE METABOLIC PANEL
ALBUMIN: 2.3 g/dL — AB (ref 3.5–5.2)
ALK PHOS: 66 U/L (ref 39–117)
ALT: 119 U/L — ABNORMAL HIGH (ref 0–53)
AST: 76 U/L — AB (ref 0–37)
Anion gap: 5 (ref 5–15)
BILIRUBIN TOTAL: 1 mg/dL (ref 0.3–1.2)
BUN: 8 mg/dL (ref 6–23)
CHLORIDE: 101 meq/L (ref 96–112)
CO2: 30 mmol/L (ref 19–32)
CREATININE: 0.96 mg/dL (ref 0.50–1.35)
Calcium: 7.6 mg/dL — ABNORMAL LOW (ref 8.4–10.5)
GFR calc Af Amer: >90 mL/min (ref >90)
Glucose, Bld: 154 mg/dL — ABNORMAL HIGH (ref 70–99)
POTASSIUM: 3.4 mmol/L — AB (ref 3.5–5.1)
Sodium: 136 mmol/L (ref 135–145)
Total Protein: 5.7 g/dL — ABNORMAL LOW (ref 6.0–8.3)

## 2014-02-09 LAB — CBC
HEMATOCRIT: 33.3 % — AB (ref 39.0–52.0)
Hemoglobin: 10.8 g/dL — ABNORMAL LOW (ref 13.0–17.0)
MCH: 29.5 pg (ref 26.0–34.0)
MCHC: 32.4 g/dL (ref 30.0–36.0)
MCV: 91 fL (ref 78.0–100.0)
Platelets: 221 10*3/uL (ref 150–400)
RBC: 3.66 MIL/uL — ABNORMAL LOW (ref 4.22–5.81)
RDW: 12.9 % (ref 11.5–15.5)
WBC: 9.1 10*3/uL (ref 4.0–10.5)

## 2014-02-09 MED ORDER — MORPHINE SULFATE 2 MG/ML IJ SOLN
1.0000 mg | INTRAMUSCULAR | Status: DC | PRN
  Administered 2014-02-09 – 2014-02-10 (×6): 2 mg via INTRAVENOUS
  Filled 2014-02-09 (×6): qty 1

## 2014-02-09 MED ORDER — OXYCODONE-ACETAMINOPHEN 5-325 MG PO TABS
1.0000 | ORAL_TABLET | ORAL | Status: DC | PRN
  Administered 2014-02-09: 1 via ORAL
  Administered 2014-02-09 – 2014-02-10 (×5): 2 via ORAL
  Administered 2014-02-11 (×2): 1 via ORAL
  Administered 2014-02-11: 2 via ORAL
  Filled 2014-02-09 (×3): qty 2
  Filled 2014-02-09: qty 1
  Filled 2014-02-09 (×5): qty 2
  Filled 2014-02-09: qty 1

## 2014-02-09 MED ORDER — CIPROFLOXACIN HCL 500 MG PO TABS
500.0000 mg | ORAL_TABLET | Freq: Two times a day (BID) | ORAL | Status: DC
  Administered 2014-02-09 – 2014-02-11 (×5): 500 mg via ORAL
  Filled 2014-02-09 (×7): qty 1

## 2014-02-09 NOTE — Progress Notes (Signed)
1 Day Post-Op  Subjective: FEELS SORE BUT IN VERY GOOD SPIRITS  Objective: Vital signs in last 24 hours: Temp:  [97.9 F (36.6 C)-99.8 F (37.7 C)] 98.9 F (37.2 C) (12/22 0507) Pulse Rate:  [97-121] 110 (12/22 0507) Resp:  [7-20] 16 (12/22 0749) BP: (126-159)/(76-104) 136/81 mmHg (12/22 0507) SpO2:  [90 %-97 %] 95 % (12/22 0749) FiO2 (%):  [91 %-93 %] 91 % (12/22 0512) Weight:  [289 lb (131.09 kg)] 289 lb (131.09 kg) (12/21 2036) Last BM Date: 02/03/14  Intake/Output from previous day: 12/21 0701 - 12/22 0700 In: 4125 [I.V.:4125] Out: 885 [Urine:445; Drains:240; Blood:200] Intake/Output this shift: Total I/O In: -  Out: 10 [Drains:10]  Incision/Wound:CDI JP SEROUS  NO BILE OBESE  Lab Results:   Recent Labs  02/08/14 0501 02/09/14 0512  WBC 10.1 9.1  HGB 12.5* 10.8*  HCT 37.1* 33.3*  PLT 204 221   BMET  Recent Labs  02/08/14 0501 02/09/14 0512  NA 139 136  K 4.0 3.4*  CL 102 101  CO2 24 30  GLUCOSE 104* 154*  BUN 9 8  CREATININE 0.76 0.96  CALCIUM 8.6 7.6*   PT/INR No results for input(s): LABPROT, INR in the last 72 hours. ABG No results for input(s): PHART, HCO3 in the last 72 hours.  Invalid input(s): PCO2, PO2  Studies/Results: No results found.  Anti-infectives: Anti-infectives    Start     Dose/Rate Route Frequency Ordered Stop   02/08/14 0845  [MAR Hold]  cefTRIAXone (ROCEPHIN) 2 g in dextrose 5 % 50 mL IVPB - Premix     (MAR Hold since 02/08/14 0959)   2 g100 mL/hr over 30 Minutes Intravenous On call to O.R. 02/08/14 0836 02/08/14 1050      Assessment/Plan: s/p Procedure(s): ATTEMPTED LAPAROSCOPIC CHOLECYSTECTOMY (N/A) CHOLECYSTECTOMY (N/A)  OOB ADV DIET PO PAIN MEDS  D/C PCA CONT ABX FOR TOTAL OF 7 DAYS  LOS: 5 days    Dura Mccormack A. 02/09/2014

## 2014-02-09 NOTE — Discharge Instructions (Signed)
[facility]      [facility] °[phone] ° °OPEN ABDOMINAL SURGERY: POST OP INSTRUCTIONS ° °Always review your discharge instruction sheet given to you by the facility where your surgery was performed. ° °IF YOU HAVE DISABILITY OR FAMILY LEAVE FORMS, YOU MUST BRING THEM TO THE OFFICE FOR PROCESSING.  PLEASE DO NOT GIVE THEM TO YOUR DOCTOR. ° °1. A prescription for pain medication may be given to you upon discharge.  Take your pain medication as prescribed, if needed.  If narcotic pain medicine is not needed, then you may take acetaminophen (Tylenol) or ibuprofen (Advil) as needed. °2. Take your usually prescribed medications unless otherwise directed. °3. If you need a refill on your pain medication, please contact your pharmacy. They will contact our office to request authorization.  Prescriptions will not be filled after 5pm or on week-ends. °4. You should follow a light diet the first few days after arrival home, such as soup and crackers, pudding, etc.unless your doctor has advised otherwise. A high-fiber, low fat diet can be resumed as tolerated.   Be sure to include lots of fluids daily. Most patients will experience some swelling and bruising on the chest and neck area.  Ice packs will help.  Swelling and bruising can take several days to resolve °5. Most patients will experience some swelling and bruising in the area of the incision. Ice pack will help. Swelling and bruising can take several days to resolve..  °6. It is common to experience some constipation if taking pain medication after surgery.  Increasing fluid intake and taking a stool softener will usually help or prevent this problem from occurring.  A mild laxative (Milk of Magnesia or Miralax) should be taken according to package directions if there are no bowel movements after 48 hours. °7.  You may have steri-strips (small skin tapes) in place directly over the incision.  These strips should be left on the skin for 7-10 days.  If your  surgeon used skin glue on the incision, you may shower in 24 hours.  The glue will flake off over the next 2-3 weeks.  Any sutures or staples will be removed at the office during your follow-up visit. You may find that a light gauze bandage over your incision may keep your staples from being rubbed or pulled. You may shower and replace the bandage daily. °8. ACTIVITIES:  You may resume regular (light) daily activities beginning the next day--such as daily self-care, walking, climbing stairs--gradually increasing activities as tolerated.  You may have sexual intercourse when it is comfortable.  Refrain from any heavy lifting or straining until approved by your doctor. °a. You may drive when you no longer are taking prescription pain medication, you can comfortably wear a seatbelt, and you can safely maneuver your car and apply brakes °b. Return to Work: ___________________________________ °9. You should see your doctor in the office for a follow-up appointment approximately two weeks after your surgery.  Make sure that you call for this appointment within a day or two after you arrive home to insure a convenient appointment time. °OTHER INSTRUCTIONS:  °_____________________________________________________________ °_____________________________________________________________ ° °WHEN TO CALL YOUR DOCTOR: °1. Fever over 101.0 °2. Inability to urinate °3. Nausea and/or vomiting °4. Extreme swelling or bruising °5. Continued bleeding from incision. °6. Increased pain, redness, or drainage from the incision. °7. Difficulty swallowing or breathing °8. Muscle cramping or spasms. °9. Numbness or tingling in hands or feet or around lips. ° °The clinic staff is available to   answer your questions during regular business hours.  Please dont hesitate to call and ask to speak to one of the nurses if you have concerns.  For further questions, please visit [email or web address]  Bulb Drain Home Care A bulb drain  consists of a thin rubber tube and a soft, round bulb that creates a gentle suction. The rubber tube is placed in the area where you had surgery. A bulb is attached to the end of the tube that is outside the body. The bulb drain removes excess fluid that normally builds up in a surgical wound after surgery. The color and amount of fluid will vary. Immediately after surgery, the fluid is bright red and is a little thicker than water. It may gradually change to a yellow or pink color and become more thin and water-like. When the amount decreases to about 1 or 2 tbsp in 24 hours, your health care provider will usually remove it. DAILY CARE  Keep the bulb flat (compressed) at all times, except while emptying it. The flatness creates suction. You can flatten the bulb by squeezing it firmly in the middle and then closing the cap.  Keep sites where the tube enters the skin dry and covered with a bandage (dressing).  Secure the tube 1-2 in (2.5-5.1 cm) below the insertion sites to keep it from pulling on your stitches. The tube is stitched in place and will not slip out.  Secure the bulb as directed by your health care provider.  For the first 3 days after surgery, there usually is more fluid in the bulb. Empty the bulb whenever it becomes half full because the bulb does not create enough suction if it is too full. The bulb could also overflow. Write down how much fluid you remove each time you empty your drain. Add up the amount removed in 24 hours.  Empty the bulb at the same time every day once the amount of fluid decreases and you only need to empty it once a day. Write down the amounts and the 24-hour totals to give to your health care provider. This helps your health care provider know when the tubes can be removed. EMPTYING THE BULB DRAIN Before emptying the bulb, get a measuring cup, a piece of paper and a pen, and wash your hands.  Gently run your fingers down the tube (stripping) to empty any  drainage from the tubing into the bulb. This may need to be done several times a day to clear the tubing of clots and tissue.  Open the bulb cap to release suction, which causes it to inflate. Do not touch the inside of the cap.  Gently run your fingers down the tube (stripping) to empty any drainage from the tubing into the bulb.  Hold the cap out of the way, and pour fluid into the measuring cup.   Squeeze the bulb to provide suction.  Replace the cap.   Check the tape that holds the tube to your skin. If it is becoming loose, you can remove the loose piece of tape and apply a new one. Then, pin the bulb to your shirt.   Write down the amount of fluid you emptied out. Write down the date and each time you emptied your bulb drain. (If there are 2 bulbs, note the amount of drainage from each bulb and keep the totals separate. Your health care provider will want to know the total amounts for each drain and which tube is draining more.)  Flush the fluid down the toilet and wash your hands.   Call your health care provider once you have less than 2 tbsp of fluid collecting in the bulb drain every 24 hours. If there is drainage around the tube site, change dressings and keep the area dry. Cleanse around tube with sterile saline and place dry gauze around site. This gauze should be changed when it is soiled. If it stays clean and unsoiled, it should still be changed daily.  SEEK MEDICAL CARE IF:  Your drainage has a bad smell or is cloudy.   You have a fever.   Your drainage is increasing instead of decreasing.   Your tube fell out.   You have redness or swelling around the tube site.   You have drainage from a surgical wound.   Your bulb drain will not stay flat after you empty it.  MAKE SURE YOU:   Understand these instructions.  Will watch your condition.  Will get help right away if you are not doing well or get worse. Document Released: 02/03/2000 Document  Revised: 06/22/2013 Document Reviewed: 07/11/2011 [facility] Patient Information 2015 [place], [place]. This information is not intended to replace advice given to you by your health care provider. Make sure you discuss any questions you have with your health care provider.

## 2014-02-09 NOTE — Progress Notes (Signed)
PATIENT DETAILS Name: [name] [name] Age: 43 y.o. Sex: male Date of Birth: 1970-05-15 Admit Date: 02/04/2014 Admitting Physician Berle Mull, MD QPR:FFMBWGY,KZLDJ L., MD   Brief summary: Patient is a 43 year old male with no significant past medical history admitted on 12/17 for abdominal pain. Further evaluation demonstrated biliary pancreatitis. Underwent laparoscopic cholecystectomy on 12/21.  Intraoperatively, found to have acute necrotizing cholecystitis as well.Doing well postoperatively,  Suspect will require another day or so in the hospital prior to discharge.  Subjective: Feels better today, less Abd pain.  Assessment/Plan: Principal Problem:   Acute Biliary pancreatitis:Admitted and started on supportive measures, kept NPO.  Gen. Surgery was consulted, patient underwent laparoscopic cholecystectomy on 12/21, intraoperatively, patient also found to have acute necrotizing cholecystitis. Discussed with general surgery-Dr. Brantley Stage , advised patient will need oral ciprofloxacin for 7 more days from 12/22. Stop PCA, and use narcotics as needed. Advance diet as tolerated. Ambulate.  Active Problems:    Acute necrotizing cholecystitis: See above.    Elevated Transminitis:secondary to above. Better-almost normalized. Acute hepatitis serology negative.Follow periodically.   Disposition: Remain inpatient  Antibiotics:  None   Anti-infectives    Start     Dose/Rate Route Frequency Ordered Stop   02/08/14 0845  [MAR Hold]  cefTRIAXone (ROCEPHIN) 2 g in dextrose 5 % 50 mL IVPB - Premix     (MAR Hold since 02/08/14 0959)   2 g100 mL/hr over 30 Minutes Intravenous On call to O.R. 02/08/14 0836 02/08/14 1050      DVT Prophylaxis: Prophylactic Lovenox   Code Status: Full code  Family Communication  Spouse at bedside.  Procedures:  None  CONSULTS:  general surgery  MEDICATIONS: Scheduled Meds: . enoxaparin (LOVENOX) injection  40 mg Subcutaneous Q24H  .  polyethylene glycol  17 g Oral Daily  . senna-docusate  2 tablet Oral BID   Continuous Infusions: . sodium chloride 50 mL/hr at 02/09/14 1012  . lactated ringers 10 mL/hr at 02/08/14 1012   PRN Meds:.morphine injection, ondansetron **OR** ondansetron (ZOFRAN) IV, oxyCODONE-acetaminophen    PHYSICAL EXAM: Vital signs in last 24 hours: Filed Vitals:   02/09/14 0019 02/09/14 0507 02/09/14 0512 02/09/14 0749  BP:  136/81    Pulse:  110    Temp:  98.9 F (37.2 C)    TempSrc:      Resp: 18 20 18 16   Height:      Weight:      SpO2: 93% 93% 90% 95%    Weight change: 6.89 kg (15 lb 3 oz) Filed Weights   02/05/14 0613 02/07/14 2136 02/08/14 2036  Weight: 122.698 kg (270 lb 8 oz) 124.2 kg (273 lb 13 oz) 131.09 kg (289 lb)   Body mass index is 39.19 kg/(m^2).   Gen Exam: Awake and alert with clear speech.   Neck: Supple, No JVD.   Chest: B/L Clear. No rales or rhochi  CVS: S1 S2 Regular, no murmurs.  Abdomen: soft, BS +,   Appropriately tender at incision site. But mostly soft. Extremities: no edema, lower extremities warm to touch. Neurologic: Non Focal.   Skin: No Rash.   Wounds: N/A.    Intake/Output from previous day:  Intake/Output Summary (Last 24 hours) at 02/09/14 1100 Last data filed at 02/09/14 0749  Gross per 24 hour  Intake   4125 ml  Output    895 ml  Net   3230 ml     LAB RESULTS: CBC  Recent Labs Lab  02/04/14 2323 02/05/14 0532 02/06/14 0515 02/07/14 0437 02/08/14 0501 02/09/14 0512  WBC 7.3 12.3* 12.7* 12.5* 10.1 9.1  HGB 16.3 15.0 12.8* 12.8* 12.5* 10.8*  HCT 44.9 42.1 37.9* 38.8* 37.1* 33.3*  PLT 257 241 194 192 204 221  MCV 87.0 87.7 89.8 90.7 90.0 91.0  MCH 31.6 31.3 30.3 29.9 30.3 29.5  MCHC 36.3* 35.6 33.8 33.0 33.7 32.4  RDW 12.2 12.6 13.0 13.0 12.7 12.9  LYMPHSABS 1.3 0.7  --   --   --   --   MONOABS 0.5 1.2*  --   --   --   --   EOSABS 0.0 0.0  --   --   --   --   BASOSABS 0.0 0.0  --   --   --   --     Chemistries   Recent  Labs Lab 02/05/14 0532 02/06/14 0515 02/07/14 0437 02/08/14 0501 02/09/14 0512  NA 139 138 141 139 136  K 4.4 3.8 4.1 4.0 3.4*  CL 102 101 102 102 101  CO2 22 27 29 24 30   GLUCOSE 135* 105* 99 104* 154*  BUN 14 16 13 9 8   CREATININE 0.98 1.19 0.98 0.76 0.96  CALCIUM 9.0 8.4 8.7 8.6 7.6*    CBG: No results for input(s): GLUCAP in the last 168 hours.  GFR Estimated Creatinine Clearance: 136.1 mL/min (by C-G formula based on Cr of 0.96).  Coagulation profile  Recent Labs Lab 02/05/14 0532  INR 1.09    Cardiac Enzymes No results for input(s): CKMB, TROPONINI, MYOGLOBIN in the last 168 hours.  Invalid input(s): CK  Invalid input(s): POCBNP No results for input(s): DDIMER in the last 72 hours. No results for input(s): HGBA1C in the last 72 hours. No results for input(s): CHOL, HDL, LDLCALC, TRIG, CHOLHDL, LDLDIRECT in the last 72 hours. No results for input(s): TSH, T4TOTAL, T3FREE, THYROIDAB in the last 72 hours.  Invalid input(s): FREET3 No results for input(s): VITAMINB12, FOLATE, FERRITIN, TIBC, IRON, RETICCTPCT in the last 72 hours.  Recent Labs  02/07/14 0437  LIPASE 62*    Urine Studies No results for input(s): UHGB, CRYS in the last 72 hours.  Invalid input(s): UACOL, UAPR, USPG, UPH, UTP, UGL, UKET, UBIL, UNIT, UROB, ULEU, UEPI, UWBC, URBC, UBAC, CAST, UCOM, BILUA  MICROBIOLOGY: No results found for this or any previous visit (from the past 240 hour(s)).  RADIOLOGY STUDIES/RESULTS: Ct Abdomen Pelvis W Contrast  02/05/2014   CLINICAL DATA:  Diffuse abdominal pain, sharp stabbing pain beginning this morning. History of abdominal hernia.  EXAM: CT ABDOMEN AND PELVIS WITH CONTRAST  TECHNIQUE: Multidetector CT imaging of the abdomen and pelvis was performed using the standard protocol following bolus administration of intravenous contrast.  CONTRAST:  152mL OMNIPAQUE IOHEXOL 300 MG/ML  SOLN  COMPARISON:  None.  FINDINGS: LUNG BASES: Lingular wedge-like  consolidation with ground-glass nodular density. LEFT pleural thickening. Heart size is normal, no pericardial fluid collections.  SOLID ORGANS: The liver is diffusely hypodense most consistent with steatosis. Sub cm cyst in LEFT lobe of the liver. Tiny layering gallstones, extending to the gallbladder neck without gallbladder distension or inflammatory changes. Mild inflammatory changes and trace free fluid about the pancreatic head and duodenum, coronal 92/162. Homogeneous enhancement of the pancreas, no pancreatic duct dilatation. Normal appearance of the spleen and adrenal glands.  GASTROINTESTINAL TRACT: The stomach, small and large bowel are normal in course and caliber without inflammatory changes. Normal appendix.  KIDNEYS/ URINARY TRACT: Kidneys are orthotopic,  demonstrating symmetric enhancement. No nephrolithiasis, hydronephrosis or solid renal masses. The unopacified ureters are normal in course and caliber. Delayed imaging through the kidneys demonstrates symmetric prompt contrast excretion within the proximal urinary collecting system. Urinary bladder is partially distended and unremarkable.  PERITONEUM/RETROPERITONEUM: No intraperitoneal free air. Aortoiliac vessels are normal in course and caliber. No lymphadenopathy by CT size criteria. Internal reproductive organs are unremarkable.  SOFT TISSUE/OSSEOUS STRUCTURES: Ill-defined geographic sclerotic lesion in the LEFT femur metadiaphysis may reflect enchondroma. Nonspecific 8 mm subcentimeter sclerotic lesion LEFT eighth rib could reflect bone island. Severe L5-S1 degenerative disc. Moderate fat containing ventral hernia. Small fat containing umbilical hernia. Small bilateral fat containing inguinal hernias. Rectus abdominis diastases.  IMPRESSION: Mild inflammatory changes about the pancreas, duodenum suggesting pancreatitis without necrosis.  Cholelithiasis, no CT findings of acute cholecystitis.  Markedly hepatic steatosis, superimposed hepatitis  not excluded. Lingular atelectasis and pleural thickening, less likely superimposed pneumonia.   Electronically Signed   By: Elon Alas   On: 02/05/2014 02:10    Oren Binet, MD  [facility] Pager:[phone]  If 7PM-7AM, please contact night-coverage [email or web address] Password [email or web address] 02/09/2014, 11:00 AM   LOS: 5 days

## 2014-02-10 DIAGNOSIS — K859 Acute pancreatitis, unspecified: Secondary | ICD-10-CM

## 2014-02-10 DIAGNOSIS — E876 Hypokalemia: Secondary | ICD-10-CM

## 2014-02-10 LAB — CBC
HCT: 32.2 % — ABNORMAL LOW (ref 39.0–52.0)
HEMOGLOBIN: 10.8 g/dL — AB (ref 13.0–17.0)
MCH: 30.9 pg (ref 26.0–34.0)
MCHC: 33.5 g/dL (ref 30.0–36.0)
MCV: 92 fL (ref 78.0–100.0)
PLATELETS: 225 10*3/uL (ref 150–400)
RBC: 3.5 MIL/uL — AB (ref 4.22–5.81)
RDW: 12.7 % (ref 11.5–15.5)
WBC: 9.1 10*3/uL (ref 4.0–10.5)

## 2014-02-10 LAB — COMPREHENSIVE METABOLIC PANEL
ALT: 96 U/L — AB (ref 0–53)
AST: 55 U/L — ABNORMAL HIGH (ref 0–37)
Albumin: 2.3 g/dL — ABNORMAL LOW (ref 3.5–5.2)
Alkaline Phosphatase: 68 U/L (ref 39–117)
Anion gap: 10 (ref 5–15)
BILIRUBIN TOTAL: 0.6 mg/dL (ref 0.3–1.2)
BUN: 8 mg/dL (ref 6–23)
CALCIUM: 7.7 mg/dL — AB (ref 8.4–10.5)
CO2: 27 mmol/L (ref 19–32)
Chloride: 97 mEq/L (ref 96–112)
Creatinine, Ser: 0.96 mg/dL (ref 0.50–1.35)
GFR calc Af Amer: >90 mL/min (ref >90)
GLUCOSE: 140 mg/dL — AB (ref 70–99)
Potassium: 3.1 mmol/L — ABNORMAL LOW (ref 3.5–5.1)
SODIUM: 134 mmol/L — AB (ref 135–145)
Total Protein: 5.4 g/dL — ABNORMAL LOW (ref 6.0–8.3)

## 2014-02-10 MED ORDER — POTASSIUM CHLORIDE 10 MEQ/100ML IV SOLN
10.0000 meq | INTRAVENOUS | Status: AC
  Administered 2014-02-10 (×2): 10 meq via INTRAVENOUS
  Filled 2014-02-10 (×2): qty 100

## 2014-02-10 NOTE — Progress Notes (Signed)
Pt a/o, c/o abd pain PRN percocet given as ordered, vss, pt stable

## 2014-02-10 NOTE — Progress Notes (Signed)
Patient ID: [name] [name], male   DOB: 03-Oct-1970, 43 y.o.   MRN: [id number]   LOS: 6 days   POD#2  Subjective: Feeling good, pain controlled. Has been using morphine regularly because it's "easy" but says the oxycodone at the current dosage is fine.   Objective: Vital signs in last 24 hours: Temp:  [98.1 F (36.7 C)-98.6 F (37 C)] 98.1 F (36.7 C) (12/23 0931) Pulse Rate:  [97-108] 102 (12/23 0931) Resp:  [18] 18 (12/23 0931) BP: (126-143)/(78-84) 143/84 mmHg (12/23 0931) SpO2:  [92 %-94 %] 93 % (12/23 0931) Weight:  [287 lb 0.6 oz (130.2 kg)] 287 lb 0.6 oz (130.2 kg) (12/22 2135) Last BM Date: 02/03/14   Laboratory  CBC  Recent Labs  02/09/14 0512 02/10/14 0445  WBC 9.1 9.1  HGB 10.8* 10.8*  HCT 33.3* 32.2*  PLT 221 225   BMET  Recent Labs  02/09/14 0512 02/10/14 0445  NA 136 134*  K 3.4* 3.1*  CL 101 97  CO2 30 27  GLUCOSE 154* 140*  BUN 8 8  CREATININE 0.96 0.96  CALCIUM 7.6* 7.7*    Physical Exam General appearance: alert and no distress GI: Soft, +BS, incision C/D/I under honeycomb dressing   Assessment/Plan: ATTEMPTED LAPAROSCOPIC CHOLECYSTECTOMY (N/A) CHOLECYSTECTOMY (N/A)  OOB Encouraged PO PAIN MEDS  CONT ABX FOR TOTAL OF 7 DAYS Likely d/c home tomorrow    Lisette Abu, PA-C Pager: [phone] 02/10/2014

## 2014-02-10 NOTE — Progress Notes (Addendum)
PATIENT DETAILS Name: [name] [name] Age: 43 y.o. Sex: male Date of Birth: September 15, 1970 Admit Date: 02/04/2014 Admitting Physician Berle Mull, MD PJA:[id number],QBHAL L., MD   Brief summary: From prior PN Patient is a 43 year old male with no significant past medical history admitted on 12/17 for abdominal pain. Further evaluation demonstrated biliary pancreatitis. Underwent laparoscopic cholecystectomy on 12/21.  Intraoperatively, found to have acute necrotizing cholecystitis.  Subjective: No new complaints. No acute issues reported to me overnight.  Assessment/Plan: Principal Problem:   Acute Biliary pancreatitis: - Continue supportive therapy  Active Problems:    Acute necrotizing cholecystitis:  - Pt is pod # 2 ATTEMPTED LAPAROSCOPIC CHOLECYSTECTOMY (N/A) CHOLECYSTECTOMY  - General surgery on board and manaing.    Elevated Transminitis: - Secondary to above, improving currently   Disposition: Remain inpatient  Antibiotics:  cipro   Anti-infectives    Start     Dose/Rate Route Frequency Ordered Stop   02/09/14 1200  ciprofloxacin (CIPRO) tablet 500 mg     500 mg Oral 2 times daily 02/09/14 1107 02/16/14 0759   02/08/14 0845  [MAR Hold]  cefTRIAXone (ROCEPHIN) 2 g in dextrose 5 % 50 mL IVPB - Premix     (MAR Hold since 02/08/14 0959)   2 g100 mL/hr over 30 Minutes Intravenous On call to O.R. 02/08/14 0836 02/08/14 1050      DVT Prophylaxis: Prophylactic Lovenox   Code Status: Full code  Family Communication Sister at bedside.  Procedures:  None  CONSULTS:  general surgery  MEDICATIONS: Scheduled Meds: . ciprofloxacin  500 mg Oral BID  . enoxaparin (LOVENOX) injection  40 mg Subcutaneous Q24H  . polyethylene glycol  17 g Oral Daily  . senna-docusate  2 tablet Oral BID   Continuous Infusions: . sodium chloride 40 mL/hr at 02/09/14 1137  . lactated ringers 10 mL/hr at 02/08/14 1012   PRN Meds:.morphine injection, ondansetron **OR**  ondansetron (ZOFRAN) IV, oxyCODONE-acetaminophen    PHYSICAL EXAM: Vital signs in last 24 hours: Filed Vitals:   02/09/14 0749 02/09/14 2135 02/10/14 0537 02/10/14 0931  BP:  126/82 133/78 143/84  Pulse:  108 97 102  Temp:  98.6 F (37 C) 98.4 F (36.9 C) 98.1 F (36.7 C)  TempSrc:  Oral Oral Oral  Resp: 16 18 18 18   Height:      Weight:  130.2 kg (287 lb 0.6 oz)    SpO2: 95% 94% 92% 93%    Weight change: -0.89 kg (-1 lb 15.4 oz) Filed Weights   02/07/14 2136 02/08/14 2036 02/09/14 2135  Weight: 124.2 kg (273 lb 13 oz) 131.09 kg (289 lb) 130.2 kg (287 lb 0.6 oz)   Body mass index is 38.92 kg/(m^2).   Gen Exam: Awake and alert with clear speech.   Neck: Supple, No thyroimegally  Chest: B/L Clear. No rales or rhochi  CVS: S1 S2 Regular, no murmurs.  Abdomen: soft, BS +,   Appropriately tender at incision site. Hypoactive bowel sounds Extremities: no edema, lower extremities warm to touch. Neurologic: no facial asymmetry, moves extremities equally Skin: No Rash.   Wounds: N/A.    Intake/Output from previous day:  Intake/Output Summary (Last 24 hours) at 02/10/14 1233 Last data filed at 02/10/14 1100  Gross per 24 hour  Intake  282.5 ml  Output    855 ml  Net -572.5 ml     LAB RESULTS: CBC  Recent Labs Lab 02/04/14 2323 02/05/14 0532 02/06/14 0515 02/07/14 0437 02/08/14  0501 02/09/14 0512 02/10/14 0445  WBC 7.3 12.3* 12.7* 12.5* 10.1 9.1 9.1  HGB 16.3 15.0 12.8* 12.8* 12.5* 10.8* 10.8*  HCT 44.9 42.1 37.9* 38.8* 37.1* 33.3* 32.2*  PLT 257 241 194 192 204 221 225  MCV 87.0 87.7 89.8 90.7 90.0 91.0 92.0  MCH 31.6 31.3 30.3 29.9 30.3 29.5 30.9  MCHC 36.3* 35.6 33.8 33.0 33.7 32.4 33.5  RDW 12.2 12.6 13.0 13.0 12.7 12.9 12.7  LYMPHSABS 1.3 0.7  --   --   --   --   --   MONOABS 0.5 1.2*  --   --   --   --   --   EOSABS 0.0 0.0  --   --   --   --   --   BASOSABS 0.0 0.0  --   --   --   --   --     Chemistries   Recent Labs Lab 02/06/14 0515  02/07/14 0437 02/08/14 0501 02/09/14 0512 02/10/14 0445  NA 138 141 139 136 134*  K 3.8 4.1 4.0 3.4* 3.1*  CL 101 102 102 101 97  CO2 27 29 24 30 27   GLUCOSE 105* 99 104* 154* 140*  BUN 16 13 9 8 8   CREATININE 1.19 0.98 0.76 0.96 0.96  CALCIUM 8.4 8.7 8.6 7.6* 7.7*    CBG: No results for input(s): GLUCAP in the last 168 hours.  GFR Estimated Creatinine Clearance: 135.5 mL/min (by C-G formula based on Cr of 0.96).  Coagulation profile  Recent Labs Lab 02/05/14 0532  INR 1.09    Cardiac Enzymes No results for input(s): CKMB, TROPONINI, MYOGLOBIN in the last 168 hours.  Invalid input(s): CK  Invalid input(s): POCBNP No results for input(s): DDIMER in the last 72 hours. No results for input(s): HGBA1C in the last 72 hours. No results for input(s): CHOL, HDL, LDLCALC, TRIG, CHOLHDL, LDLDIRECT in the last 72 hours. No results for input(s): TSH, T4TOTAL, T3FREE, THYROIDAB in the last 72 hours.  Invalid input(s): FREET3 No results for input(s): VITAMINB12, FOLATE, FERRITIN, TIBC, IRON, RETICCTPCT in the last 72 hours. No results for input(s): LIPASE, AMYLASE in the last 72 hours.  Urine Studies No results for input(s): UHGB, CRYS in the last 72 hours.  Invalid input(s): UACOL, UAPR, USPG, UPH, UTP, UGL, UKET, UBIL, UNIT, UROB, ULEU, UEPI, UWBC, URBC, UBAC, CAST, UCOM, BILUA  MICROBIOLOGY: No results found for this or any previous visit (from the past 240 hour(s)).  RADIOLOGY STUDIES/RESULTS: Ct Abdomen Pelvis W Contrast  02/05/2014   CLINICAL DATA:  Diffuse abdominal pain, sharp stabbing pain beginning this morning. History of abdominal hernia.  EXAM: CT ABDOMEN AND PELVIS WITH CONTRAST  TECHNIQUE: Multidetector CT imaging of the abdomen and pelvis was performed using the standard protocol following bolus administration of intravenous contrast.  CONTRAST:  158mL OMNIPAQUE IOHEXOL 300 MG/ML  SOLN  COMPARISON:  None.  FINDINGS: LUNG BASES: Lingular wedge-like  consolidation with ground-glass nodular density. LEFT pleural thickening. Heart size is normal, no pericardial fluid collections.  SOLID ORGANS: The liver is diffusely hypodense most consistent with steatosis. Sub cm cyst in LEFT lobe of the liver. Tiny layering gallstones, extending to the gallbladder neck without gallbladder distension or inflammatory changes. Mild inflammatory changes and trace free fluid about the pancreatic head and duodenum, coronal 92/162. Homogeneous enhancement of the pancreas, no pancreatic duct dilatation. Normal appearance of the spleen and adrenal glands.  GASTROINTESTINAL TRACT: The stomach, small and large bowel are normal in course and  caliber without inflammatory changes. Normal appendix.  KIDNEYS/ URINARY TRACT: Kidneys are orthotopic, demonstrating symmetric enhancement. No nephrolithiasis, hydronephrosis or solid renal masses. The unopacified ureters are normal in course and caliber. Delayed imaging through the kidneys demonstrates symmetric prompt contrast excretion within the proximal urinary collecting system. Urinary bladder is partially distended and unremarkable.  PERITONEUM/RETROPERITONEUM: No intraperitoneal free air. Aortoiliac vessels are normal in course and caliber. No lymphadenopathy by CT size criteria. Internal reproductive organs are unremarkable.  SOFT TISSUE/OSSEOUS STRUCTURES: Ill-defined geographic sclerotic lesion in the LEFT femur metadiaphysis may reflect enchondroma. Nonspecific 8 mm subcentimeter sclerotic lesion LEFT eighth rib could reflect bone island. Severe L5-S1 degenerative disc. Moderate fat containing ventral hernia. Small fat containing umbilical hernia. Small bilateral fat containing inguinal hernias. Rectus abdominis diastases.  IMPRESSION: Mild inflammatory changes about the pancreas, duodenum suggesting pancreatitis without necrosis.  Cholelithiasis, no CT findings of acute cholecystitis.  Markedly hepatic steatosis, superimposed hepatitis  not excluded. Lingular atelectasis and pleural thickening, less likely superimposed pneumonia.   Electronically Signed   By: Elon Alas   On: 02/05/2014 02:10    Velvet Bathe, MD  [facility] Pager:[phone]  If 7PM-7AM, please contact night-coverage [email or web address] Password [email or web address] 02/10/2014, 12:33 PM   LOS: 6 days   Addendum Hypokalemia - Will replace and reassess - most likely due to poor oral intake.

## 2014-02-11 MED ORDER — POLYETHYLENE GLYCOL 3350 17 G PO PACK: 17.0000 g | PACK | Freq: Every day | ORAL | Status: DC

## 2014-02-11 MED ORDER — OXYCODONE-ACETAMINOPHEN 7.5-325 MG PO TABS
1.0000 | ORAL_TABLET | ORAL | Status: DC | PRN
Start: 2014-02-11 — End: 2015-08-01

## 2014-02-11 MED ORDER — CIPROFLOXACIN HCL 500 MG PO TABS: 500.0000 mg | ORAL_TABLET | Freq: Two times a day (BID) | ORAL | Status: DC

## 2014-02-11 NOTE — Progress Notes (Signed)
Patient seen and evaluated by General surgery on day of d/c. Gen Surg has completed d/c summary. Would like to thank Gen surg for their assistance with this case.  [name] [name], [employer]

## 2014-02-11 NOTE — Progress Notes (Signed)
Patient Discharge: Disposition: patient discharged to home with wife and daughter.  Education: Patient and wife educated about surgical wound care and also about JP drain.  Wife and patient know how to drain the tube and how to make it flat after.  Changed the dressing by putting 4 x 4 dry guaze and secured with tape per Dr's order.  Patient knows how to change dressing, when to change and when to call the doctor.   IV: Discontinued 2 peripheral IVs before discharge.   Telemetry: Discontinued Tele monitoring before discharge.  CCMD notified. Follow-up appointments:  Educated him about the follow-up appointments. Prescriptions: Explained him about the prescriptions. Transportation: He transported in w/c with wife and daughter accompanying. Belongings: Patient took al his belongings with him.

## 2014-02-11 NOTE — Discharge Summary (Signed)
  Patient ID: [name] [name] [id number] 43 y.o. [birth year]  Admit date: 02/04/2014  Discharge date and time: 02/11/2014  Admitting Physician: Christie Beckers, MD  Discharge Physician: Adin Hector  Admission Diagnoses: Epigastric abdominal pain [R10.13] Acute pancreatitis, unspecified pancreatitis type [K85.9]  Discharge Diagnoses: Biliary pancreatitis Acute necrotizing cholecystitis Obesity Epigastric hernia  Operations: Procedure(s): ATTEMPTED LAPAROSCOPIC CHOLECYSTECTOMY CHOLECYSTECTOMY  Admission Condition: fair  Discharged Condition: good  Indication for Admission: This is a 43 year old man who was admitted by the medical service with gallstone pancreatitis. Gallstone pancreatitis resolved by exam and laboratory studies but he continued to have right upper quadrant pain. He was evaluated by Dr. [name] who felt the cholecystectomy was indicated and that he had acute cholecystitis.  Hospital Course: The patient was admitted by the internal medicine service on 02/05/2014. He was treated medically for pancreatitis which subsided chemically and clinically but he continued to have clinical evidence of acute cholecystitis.     He was taken to the operating room by Dr. Brantley Stage on 02/08/2014. He had acute necrotizing cholecystitis. He had a laparoscopy converted to open cholecystectomy. A drain was placed. Over the next 3 days the patient progressed in his diet activities. At this time he is tolerating a regular diet, passing flatus but no stool. Voiding normally. Ambulate independently. He and his wife are interested in him going home.      At the time of discharge he looked well. He was afebrile. His right subcostal incision was stapled closed and there was no sign of infection. The drainage from his JP drain was fairly thin and had a slight brownish tinge to it but it was watery. There is a nonreducible epigastric hernia which she states he's had for decades. His abdomen was  otherwise soft.     He was counseled in wound care, drain care, diet, activities, restrictions. He was given a prescription for Cipro for 4 more days. He was given a prescription for Percocet. He was encouraged to stay hydrated and ambulate frequently. He has an appointment with Dr. Brantley Stage on January 4 this was discussed with him and his wife.  Consults: None  Significant Diagnostic Studies: Imaging studies. Lab tests. Surgical pathology.  Treatments: surgery: Open cholecystectomy with drain placement  Disposition: Home  Patient Instructions:    Medication List    TAKE these medications        acetaminophen 325 MG tablet  Commonly known as:  TYLENOL  Take 650 mg by mouth every 6 (six) hours as needed for mild pain.     ciprofloxacin 500 MG tablet  Commonly known as:  CIPRO  Take 1 tablet (500 mg total) by mouth 2 (two) times daily.     oxyCODONE-acetaminophen 7.5-325 MG per tablet  Commonly known as:  PERCOCET  Take 1 tablet by mouth every 4 (four) hours as needed for pain.        Activity: No sports or heavy lifting for 5 weeks. No driving a car for 2 weeks. Diet: low fat, low cholesterol diet Wound Care: as directed  Follow-up:  With Dr. Brantley Stage in 10 days.(January 4 - call to confirm time)  Signed: Edsel Petrin. Dalbert Batman, M.D., FACS General and minimally invasive surgery Breast and Colorectal Surgery  02/11/2014, 8:04 AM

## 2015-08-01 ENCOUNTER — Emergency Department (HOSPITAL_COMMUNITY)
Admission: EM | Admit: 2015-08-01 | Discharge: 2015-08-01 | Disposition: A | Discharge status: Home / Self Care | Payer: 59 | Attending: Emergency Medicine | Admitting: Emergency Medicine

## 2015-08-01 ENCOUNTER — Emergency Department (HOSPITAL_COMMUNITY): Payer: 59

## 2015-08-01 ENCOUNTER — Encounter (HOSPITAL_COMMUNITY): Payer: Self-pay | Admitting: Emergency Medicine

## 2015-08-01 DIAGNOSIS — K439 Ventral hernia without obstruction or gangrene: Secondary | ICD-10-CM | POA: Insufficient documentation

## 2015-08-01 DIAGNOSIS — R109 Unspecified abdominal pain: Secondary | ICD-10-CM | POA: Diagnosis present

## 2015-08-01 DIAGNOSIS — K432 Incisional hernia without obstruction or gangrene: Secondary | ICD-10-CM

## 2015-08-01 LAB — URINALYSIS, ROUTINE W REFLEX MICROSCOPIC
Bilirubin Urine: NEGATIVE
GLUCOSE, UA: NEGATIVE mg/dL
Hgb urine dipstick: NEGATIVE
KETONES UR: NEGATIVE mg/dL
LEUKOCYTES UA: NEGATIVE
NITRITE: NEGATIVE
PH: 5.5 (ref 5.0–8.0)
Protein, ur: NEGATIVE mg/dL
Specific Gravity, Urine: 1.037 — ABNORMAL HIGH (ref 1.005–1.030)

## 2015-08-01 LAB — COMPREHENSIVE METABOLIC PANEL
ALT: 35 U/L (ref 17–63)
ANION GAP: 7 (ref 5–15)
AST: 27 U/L (ref 15–41)
Albumin: 4.3 g/dL (ref 3.5–5.0)
Alkaline Phosphatase: 77 U/L (ref 38–126)
BILIRUBIN TOTAL: 0.7 mg/dL (ref 0.3–1.2)
BUN: 18 mg/dL (ref 6–20)
CHLORIDE: 106 mmol/L (ref 101–111)
CO2: 26 mmol/L (ref 22–32)
Calcium: 9.3 mg/dL (ref 8.9–10.3)
Creatinine, Ser: 1.29 mg/dL — ABNORMAL HIGH (ref 0.61–1.24)
Glucose, Bld: 112 mg/dL — ABNORMAL HIGH (ref 65–99)
POTASSIUM: 4.1 mmol/L (ref 3.5–5.1)
Sodium: 139 mmol/L (ref 135–145)
TOTAL PROTEIN: 7.9 g/dL (ref 6.5–8.1)

## 2015-08-01 LAB — CBC
HEMATOCRIT: 44.5 % (ref 39.0–52.0)
HEMOGLOBIN: 15.5 g/dL (ref 13.0–17.0)
MCH: 29.8 pg (ref 26.0–34.0)
MCHC: 34.8 g/dL (ref 30.0–36.0)
MCV: 85.4 fL (ref 78.0–100.0)
Platelets: 248 10*3/uL (ref 150–400)
RBC: 5.21 MIL/uL (ref 4.22–5.81)
RDW: 12.6 % (ref 11.5–15.5)
WBC: 12.4 10*3/uL — AB (ref 4.0–10.5)

## 2015-08-01 LAB — LIPASE, BLOOD: LIPASE: 35 U/L (ref 11–51)

## 2015-08-01 MED ORDER — HYDROCODONE-ACETAMINOPHEN 5-325 MG PO TABS: 1.0000 | ORAL_TABLET | Freq: Four times a day (QID) | ORAL | Status: DC | PRN

## 2015-08-01 MED ORDER — ONDANSETRON HCL 4 MG/2ML IJ SOLN
4.0000 mg | Freq: Once | INTRAMUSCULAR | Status: AC
  Administered 2015-08-01: 4 mg via INTRAVENOUS
  Filled 2015-08-01: qty 2

## 2015-08-01 MED ORDER — IOPAMIDOL (ISOVUE-300) INJECTION 61%
INTRAVENOUS | Status: AC
  Administered 2015-08-01: 100 mL
  Filled 2015-08-01: qty 100

## 2015-08-01 MED ORDER — MORPHINE SULFATE (PF) 4 MG/ML IV SOLN
4.0000 mg | Freq: Once | INTRAVENOUS | Status: AC
  Administered 2015-08-01: 4 mg via INTRAVENOUS
  Filled 2015-08-01: qty 1

## 2015-08-01 NOTE — ED Notes (Signed)
Pt taking po fluids and tolerating well. 

## 2015-08-01 NOTE — Discharge Instructions (Signed)
Hernia, Adult A hernia is the bulging of an organ or tissue through a weak spot in the muscles of the abdomen (abdominal wall). Hernias develop most often near the navel or groin. There are many kinds of hernias. Common kinds include:  Femoral hernia. This kind of hernia develops under the groin in the upper thigh area.  Inguinal hernia. This kind of hernia develops in the groin or scrotum.  Umbilical hernia. This kind of hernia develops near the navel.  Hiatal hernia. This kind of hernia causes part of the stomach to be pushed up into the chest.  Incisional hernia. This kind of hernia bulges through a scar from an abdominal surgery. CAUSES This condition may be caused by:  Heavy lifting.  Coughing over a long period of time.  Straining to have a bowel movement.  An incision made during an abdominal surgery.  A birth defect (congenital defect).  Excess weight or obesity.  Smoking.  Poor nutrition.  Cystic fibrosis.  Excess fluid in the abdomen.  Undescended testicles. SYMPTOMS Symptoms of a hernia include:  A lump on the abdomen. This is the first sign of a hernia. The lump may become more obvious with standing, straining, or coughing. It may get bigger over time if it is not treated or if the condition causing it is not treated.  Pain. A hernia is usually painless, but it may become painful over time if treatment is delayed. The pain is usually dull and may get worse with standing or lifting heavy objects. Sometimes a hernia gets tightly squeezed in the weak spot (strangulated) or stuck there (incarcerated) and causes additional symptoms. These symptoms may include:  Vomiting.  Nausea.  Constipation.  Irritability. DIAGNOSIS A hernia may be diagnosed with:  A physical exam. During the exam your health care provider may ask you to cough or to make a specific movement, because a hernia is usually more visible when you move.  Imaging tests. These can  include:  X-rays.  Ultrasound.  CT scan. TREATMENT A hernia that is small and painless may not need to be treated. A hernia that is large or painful may be treated with surgery. Inguinal hernias may be treated with surgery to prevent incarceration or strangulation. Strangulated hernias are always treated with surgery, because lack of blood to the trapped organ or tissue can cause it to die. Surgery to treat a hernia involves pushing the bulge back into place and repairing the weak part of the abdomen. HOME CARE INSTRUCTIONS  Avoid straining.  Do not lift anything heavier than 10 lb (4.5 kg).  Lift with your leg muscles, not your back muscles. This helps avoid strain.  When coughing, try to cough gently.  Prevent constipation. Constipation leads to straining with bowel movements, which can make a hernia worse or cause a hernia repair to break down. You can prevent constipation by:  Eating a high-fiber diet that includes plenty of fruits and vegetables.  Drinking enough fluids to keep your urine clear or pale yellow. Aim to drink 6-8 glasses of water per day.  Using a stool softener as directed by your health care provider.  Lose weight, if you are overweight.  Do not use any tobacco products, including cigarettes, chewing tobacco, or electronic cigarettes. If you need help quitting, ask your health care provider.  Keep all follow-up visits as directed by your health care provider. This is important. Your health care provider may need to monitor your condition. SEEK MEDICAL CARE IF:  You have   swelling, redness, and pain in the affected area.  Your bowel habits change. SEEK IMMEDIATE MEDICAL CARE IF:  You have a fever.  You have abdominal pain that is getting worse.  You feel nauseous or you vomit.  You cannot push the hernia back in place by gently pressing on it while you are lying down.  The hernia:  Changes in shape or size.  Is stuck outside the  abdomen.  Becomes discolored.  Feels hard or tender.   This information is not intended to replace advice given to you by your health care provider. Make sure you discuss any questions you have with your health care provider.   Document Released: 02/05/2005 Document Revised: 02/26/2014 Document Reviewed: 12/16/2013 Elsevier Interactive Patient Education 2016 [employer]  

## 2015-08-01 NOTE — ED Provider Notes (Signed)
CSN: [id number]     Arrival date & time 08/01/15  0058 History   First MD Initiated Contact with Patient 08/01/15 0419     Chief Complaint  Patient presents with  . Abdominal Pain  . Emesis  . Hernia     (Consider location/radiation/quality/duration/timing/severity/associated sxs/prior Treatment) HPI  This a 45 year old male with a history of abdominal hernia who presents with abdominal pain and emesis. Patient reports over the last 2-3 weeks he has had increasing pain over his hernia. He has had normal bowel movements. He was told by his surgeon that "that may need to be taking care of." Tonight he had onset of vomiting. No diarrhea. Reports normal bowel movements. He had increasing pain over the hernia site. Currently he is pain-free and states that he feels much better. He's been waiting in the waiting room for approximately 3 hours. No fevers.  Past Medical History  Diagnosis Date  . Hernia of abdominal cavity "born w/it"  . Kidney stones    Past Surgical History  Procedure Laterality Date  . Ureterolithotomy  1989  . Cholecystectomy N/A 02/08/2014    Procedure: ATTEMPTED LAPAROSCOPIC CHOLECYSTECTOMY;  Surgeon: [name], MD;  Location: [facility];  Service: General;  Laterality: N/A;  . Cholecystectomy N/A 02/08/2014    Procedure: CHOLECYSTECTOMY;  Surgeon: [name], MD;  Location: [facility];  Service: General;  Laterality: N/A;   No family history on file. Social History  Substance Use Topics  . Smoking status: Never Smoker   . Smokeless tobacco: Never Used  . Alcohol Use: Yes     Comment: 02/05/2014 "I'll have a drink maybe 3 times/yr"    Review of Systems  Constitutional: Negative for fever.  Respiratory: Negative for shortness of breath.   Cardiovascular: Negative for chest pain.  Gastrointestinal: Positive for nausea, vomiting and abdominal pain. Negative for diarrhea and constipation.  All other systems reviewed and are negative.     Allergies  Review of  patient's allergies indicates no known allergies.  Home Medications   Prior to Admission medications   Medication Sig Start Date End Date Taking? Authorizing Provider  acetaminophen (TYLENOL) 325 MG tablet Take 650 mg by mouth every 6 (six) hours as needed for mild pain.   Yes Historical Provider, MD   BP 125/75 mmHg  Pulse 93  Temp(Src) 98.1 F (36.7 C) (Oral)  Resp 20  SpO2 96% Physical Exam  Constitutional: He is oriented to person, place, and time. He appears well-developed and well-nourished.  Overweight  HENT:  Head: Normocephalic and atraumatic.  Cardiovascular: Normal rate, regular rhythm and normal heart sounds.   No murmur heard. Pulmonary/Chest: Effort normal and breath sounds normal. No respiratory distress. He has no wheezes.  Abdominal: Soft. Bowel sounds are normal. There is tenderness. There is no rebound.  Tenderness to palpation with palpable mass just right of midline in the upper quadrant, not easily reducible, no overlying skin changes  Musculoskeletal: He exhibits no edema.  Neurological: He is alert and oriented to person, place, and time.  Skin: Skin is warm and dry.  Psychiatric: He has a normal mood and affect.  Nursing note and vitals reviewed.   ED Course  Procedures (including critical care time) Labs Review Labs Reviewed  COMPREHENSIVE METABOLIC PANEL - Abnormal; Notable for the following:    Glucose, Bld 112 (*)    Creatinine, Ser 1.29 (*)    All other components within normal limits  CBC - Abnormal; Notable for the following:    WBC  12.4 (*)    All other components within normal limits  URINALYSIS, ROUTINE W REFLEX MICROSCOPIC (NOT AT [facility]) - Abnormal; Notable for the following:    Specific Gravity, Urine 1.037 (*)    All other components within normal limits  LIPASE, BLOOD    Imaging Review Ct Abdomen Pelvis W Contrast  08/01/2015  CLINICAL DATA:  Umbilical pain and hernia with nausea and emesis since this evening. EXAM: CT ABDOMEN  AND PELVIS WITH CONTRAST TECHNIQUE: Multidetector CT imaging of the abdomen and pelvis was performed using the standard protocol following bolus administration of intravenous contrast. CONTRAST:  1 ISOVUE-300 IOPAMIDOL (ISOVUE-300) INJECTION 61% COMPARISON:  02/05/2014 FINDINGS: Atelectasis in the lung bases. Mild diffuse fatty infiltration of the liver. Scattered low-attenuation lesions measuring about a cm diameter. These are likely cysts. No change since prior study. Surgical absence of the gallbladder. No bile duct dilatation. Pancreas, spleen, adrenal glands, kidneys, abdominal aorta, inferior vena cava, and retroperitoneal lymph nodes are unremarkable. Ventral upper abdominal hernia containing fat with developing infiltration in the herniated fat suggesting fat necrosis. No bowel herniation. Small periumbilical hernia containing fat. Stomach, small bowel, and colon are not abnormally distended. No free air or free fluid in the abdomen. Pelvis: Prostate gland is not enlarged. Bladder wall is not thickened. No free or loculated pelvic fluid collections. Appendix is normal. No pelvic mass or lymphadenopathy. Degenerative changes in the spine. No destructive bone lesions. Focal sclerosis in the left femoral neck likely representing a benign bone island. No change since prior study. IMPRESSION: Ventral upper abdominal wall hernia containing fat with stranding in the herniated fat consistent with fat necrosis. Fat necrosis is new since previous study. No bowel herniation or obstruction. Fatty infiltration of the liver. Electronically Signed   By: Lucienne Capers M.D.   On: 08/01/2015 06:40   I have personally reviewed and evaluated these images and lab results as part of my medical decision-making.   EKG Interpretation None      MDM   Final diagnoses:  Recurrent ventral hernia    Patient presents with worsening pain over her ventral hernia. He reports improved pain since initially being triaged. No  further emesis. He reports progressive pain over the last several weeks and has scheduled an appointment with his primary surgeon Dr. Brantley Stage. He is well-appearing. Vital signs notable for a blood pressure of 181/111. This improved to 125/75. Hernia is not easily reducible on exam; however, patient states that he has never been fully reduced. Lab work is reassuring. Given worsening pain and difficulty reducing, will obtain CT scan. I did give the patient pain medication and attempt to further reduce the hernia. It was only partially reduced. CT scan shows a ventral hernia with fat with some stranding concerning for fat necrosis. This is new. Surgery consulted. Patient was updated.    Merryl Hacker, MD 08/01/15 [phone]

## 2015-08-01 NOTE — ED Notes (Signed)
Pt. reports umbilical pain /hernia with nausea and emesis onset this evening , denies diarrhea or fever.

## 2015-08-01 NOTE — ED Notes (Signed)
Pt state he understands instructions.Home stable with wife.

## 2015-08-01 NOTE — ED Notes (Signed)
Taken to CT at this time. 

## 2015-08-01 NOTE — ED Notes (Signed)
MD r. Hulen Skains at bedside.

## 2015-08-01 NOTE — Progress Notes (Signed)
Asked by Dr. Dina Rich to see this patient of Dr. Brantley Stage who has been previously evaluated for a supraumbilical hernia which has been incarcerated and the same otherwise since he was a child.  Was currently being evaluated for possible repair by Dr. Brantley Stage.  Had and episode of nausea dn vomiting last night and this morning, came to the ED where CT scan showed an incarcerated hernia in the same area with possible strangulated fat, NO BOWEL, No obstruction.  He is minimally tender, and has no diffuse abdominal discomfort.  Excellent bowel sounds, and wants to eat.  Passing gas.  WBC elevated at 12.3K, but no fevers or chills.  With hsi clinical improvement, in spite of the radiological findings, I believe that it is safe for the patient to go home with close follow up.  He has an appointment to see Dr. Brantley Stage on June 30 which he will keep.  He has been advised to call us or come in sooner if his symptoms whoudl worsen.  Kathryne Eriksson. Dahlia Bailiff, MD, [place] [phone] [facility]

## 2015-08-19 ENCOUNTER — Ambulatory Visit: Payer: Self-pay | Admitting: Surgery

## 2015-08-19 NOTE — H&P (Signed)
[name] [name] 08/19/2015 11:17 AM Location: [facility] Patient #: [id number] DOB: 06/26/1970 Single / Language: [name] / Race: White Male  History of Present Illness [name] [name] A. Aiyden Lauderback MD; 08/19/2015 12:52 PM) Patient words: f/u hernia/CT done at ED    Patient returns for follow-up after being seen in the emergency room for abdominal pain and pre-existing history of a ventral hernia. He is almost 19 months out from an open cholecystectomy. He had a known lower abdominal wall hernia present since he was a small child. 3 weeks ago he developed pain at the site as CT scanning showed omental fat within the hernia which had gotten larger. It was about 3-4 cm in maximal diameter. He feels well today but he does have intermittent pain from the hernia and desires repair at this point. He is having no significant nausea vomiting diarrhea or constipation. He is not requiring any change in activity for the most part.                    CLINICAL DATA: Umbilical pain and hernia with nausea and emesis since this evening.  EXAM: CT ABDOMEN AND PELVIS WITH CONTRAST  TECHNIQUE: Multidetector CT imaging of the abdomen and pelvis was performed using the standard protocol following bolus administration of intravenous contrast.  CONTRAST: 1 ISOVUE-300 IOPAMIDOL (ISOVUE-300) INJECTION 61%  COMPARISON: 02/05/2014  FINDINGS: Atelectasis in the lung bases.  Mild diffuse fatty infiltration of the liver. Scattered low-attenuation lesions measuring about a cm diameter. These are likely cysts. No change since prior study. Surgical absence of the gallbladder. No bile duct dilatation. Pancreas, spleen, adrenal glands, kidneys, abdominal aorta, inferior vena cava, and retroperitoneal lymph nodes are unremarkable. Ventral upper abdominal hernia containing fat with developing infiltration in the herniated fat suggesting fat necrosis. No bowel herniation.  Small periumbilical hernia containing fat. Stomach, small bowel, and colon are not abnormally distended. No free air or free fluid in the abdomen.  Pelvis: Prostate gland is not enlarged. Bladder wall is not thickened. No free or loculated pelvic fluid collections. Appendix is normal. No pelvic mass or lymphadenopathy. Degenerative changes in the spine. No destructive bone lesions. Focal sclerosis in the left femoral neck likely representing a benign bone island. No change since prior study.  IMPRESSION: Ventral upper abdominal wall hernia containing fat with stranding in the herniated fat consistent with fat necrosis. Fat necrosis is new since previous study. No bowel herniation or obstruction. Fatty infiltration of the liver.   Electronically Signed By: [name] [name] M.D.  The patient is a 45 year old male.   Problem List/Past Medical [name] [name], [place]; 08/19/2015 11:18 AM) POST-OPERATIVE STATE [phone]) VENTRAL HERNIA (K43.9)  Other Problems [name] [name], CMA; 08/19/2015 11:18 AM) Cholelithiasis Kidney Stone Other disease, cancer, significant illness Pancreatitis  Past Surgical History [name] [name], [place]; 08/19/2015 11:18 AM) Gallbladder Surgery - Open  Diagnostic Studies History [name] [name], [place]; 08/19/2015 11:18 AM) Colonoscopy never  Allergies [name] [name], CMA; 08/19/2015 11:18 AM) No Known Drug Allergies 02/23/2014  Medication History [name] [name], [place]; 08/19/2015 11:18 AM) No Current Medications  Social History [name] [name], CMA; 08/19/2015 11:18 AM) Alcohol use Occasional alcohol use. Caffeine use Carbonated beverages, Tea. No drug use Tobacco use Never smoker.  Family History [name] [name], [place]; 08/19/2015 11:18 AM) First Degree Relatives No pertinent family history    Vitals [name] CMA; 08/19/2015 11:17 AM) 08/19/2015 11:17 AM Weight: 261.38 lb Height: 72in Body Surface  Area: 2.39 m Body Mass Index: 35.45 kg/m  BP: 130/86 (Sitting, Left Arm, Standard)      Physical Exam ([name] [name] A. Kjell Brannen MD; 08/19/2015 12:52 PM)  General Mental Status-Alert. General Appearance-Consistent with stated age. Hydration-Well hydrated. Voice-Normal.  Head and Neck Head-normocephalic, atraumatic with no lesions or palpable masses.  Chest and Lung Exam Chest and lung exam reveals -quiet, even and easy respiratory effort with no use of accessory muscles and on auscultation, normal breath sounds, no adventitious sounds and normal vocal resonance. Inspection Chest Wall - Normal. Back - normal.  Cardiovascular Cardiovascular examination reveals -on palpation PMI is normal in location and amplitude, no palpable S3 or S4. Normal cardiac borders., normal heart sounds, regular rate and rhythm with no murmurs, carotid auscultation reveals no bruits and normal pedal pulses bilaterally.  Abdomen Note: Right subcostal incision noted. Below that is a 5 cm round fullness consistent with hernia with incarceration of omental fat as per CT scan. There is no redness. There is mild tenderness to palpation over this. The remainder of his abdominal exam is benign. Hernia is not reducible.  Neurologic Neurologic evaluation reveals -alert and oriented x 3 with no impairment of recent or remote memory. Mental Status-Normal.  Musculoskeletal Normal Exam - Left-Upper Extremity Strength Normal and Lower Extremity Strength Normal. Normal Exam - Right-Upper Extremity Strength Normal, Lower Extremity Weakness.    Assessment & Plan ([name] A. Peytin Dechert MD; 08/19/2015 12:53 PM)  VENTRAL HERNIA (K43.9) Impression: Discussed laparoscopic and open repairs with him. Discussed the use of mesh. Discussed potential complications of each approach and hernia surgery in general. He desires laparoscopic repair of ventral hernia with mesh. The risk of hernia repair include bleeding,  infection, organ injury, bowel injury, bladder injury, nerve injury recurrent hernia, blood clots, worsening of underlying condition, chronic pain, mesh use, open surgery, death, and the need for other operattions. Pt agrees to proceed  Current Plans You are being scheduled for surgery - Our schedulers will call you.  You should hear from our office's scheduling department within 5 working days about the location, date, and time of surgery. We try to make accommodations for patient's preferences in scheduling surgery, but sometimes the OR schedule or the surgeon's schedule prevents [place] from making those accommodations.  If you have not heard from our office [phone]) in 5 working days, call the office and ask for your surgeon's nurse.  If you have other questions about your diagnosis, plan, or surgery, call the office and ask for your surgeon's nurse.  Pt Education - Pamphlet Given - Hernia Surgery: discussed with patient and provided information. The anatomy & physiology of the abdominal wall was discussed. The pathophysiology of hernias was discussed. Natural history risks without surgery including progeressive enlargement, pain, incarceration, & strangulation was discussed. Contributors to complications such as smoking, obesity, diabetes, prior surgery, etc were discussed.  I feel the risks of no intervention will lead to serious problems that outweigh the operative risks; therefore, I recommended surgery to reduce and repair the hernia. I explained laparoscopic techniques with possible need for an open approach. I noted the probable use of mesh to patch and/or buttress the hernia repair  Risks such as bleeding, infection, abscess, need for further treatment, heart attack, death, and other risks were discussed. I noted a good likelihood this will help address the problem. Goals of post-operative recovery were discussed as well. Possibility that this will not correct all symptoms was explained.  I stressed the importance of low-impact activity, aggressive pain  control, avoiding constipation, & not pushing through pain to minimize risk of post-operative chronic pain or injury. Possibility of reherniation especially with smoking, obesity, diabetes, immunosuppression, and other health conditions was discussed. We will work to minimize complications.  An educational handout further explaining the pathology & treatment options was given as well. Questions were answered. The patient expresses understanding & wishes to proceed with surgery.  Started Norco 5-325MG , 1 (one) Tablet four times daily, as needed, #20, 08/19/2015, No Refill.

## 2015-09-21 ENCOUNTER — Encounter (HOSPITAL_COMMUNITY)
Admission: RE | Admit: 2015-09-21 | Discharge: 2015-09-21 | Disposition: A | Discharge status: Home / Self Care | Payer: 59 | Source: Ambulatory Visit | Attending: Surgery | Admitting: Surgery

## 2015-09-21 ENCOUNTER — Encounter (HOSPITAL_COMMUNITY): Payer: Self-pay

## 2015-09-21 DIAGNOSIS — Z01812 Encounter for preprocedural laboratory examination: Secondary | ICD-10-CM | POA: Insufficient documentation

## 2015-09-21 DIAGNOSIS — K439 Ventral hernia without obstruction or gangrene: Secondary | ICD-10-CM | POA: Diagnosis not present

## 2015-09-21 DIAGNOSIS — Z01818 Encounter for other preprocedural examination: Secondary | ICD-10-CM | POA: Diagnosis present

## 2015-09-21 HISTORY — DX: Unspecified osteoarthritis, unspecified site: M19.90

## 2015-09-21 HISTORY — DX: Anxiety disorder, unspecified: F41.9

## 2015-09-21 LAB — CBC
HEMATOCRIT: 41.8 % (ref 39.0–52.0)
HEMOGLOBIN: 14.4 g/dL (ref 13.0–17.0)
MCH: 30.1 pg (ref 26.0–34.0)
MCHC: 34.4 g/dL (ref 30.0–36.0)
MCV: 87.3 fL (ref 78.0–100.0)
Platelets: 225 10*3/uL (ref 150–400)
RBC: 4.79 MIL/uL (ref 4.22–5.81)
RDW: 12.6 % (ref 11.5–15.5)
WBC: 8.8 10*3/uL (ref 4.0–10.5)

## 2015-09-21 LAB — BASIC METABOLIC PANEL
Anion gap: 8 (ref 5–15)
BUN: 15 mg/dL (ref 6–20)
CALCIUM: 9.1 mg/dL (ref 8.9–10.3)
CHLORIDE: 111 mmol/L (ref 101–111)
CO2: 22 mmol/L (ref 22–32)
Creatinine, Ser: 1.08 mg/dL (ref 0.61–1.24)
GFR calc non Af Amer: >60 mL/min (ref >60)
GLUCOSE: 97 mg/dL (ref 65–99)
Potassium: 4 mmol/L (ref 3.5–5.1)
SODIUM: 141 mmol/L (ref 135–145)

## 2015-09-21 NOTE — Progress Notes (Signed)
Pt. Reports that he walks 2 miles q day & he did a twelve mile hike at [employer] on 09/17/2015, pt. With elevated BP today, will order per anesth. orders will do an EKG

## 2015-09-21 NOTE — Pre-Procedure Instructions (Signed)
Sylvia Stanberry Keelin  09/21/2015      [facility] - [place] - [address] [place] [address] [place] [address] Phone: [phone] Fax: [phone]    Your procedure is scheduled on 09/29/2015  Report to [facility] Admitting at 10:15 A.M.  Call this number if you have problems the morning of surgery:  [phone]   Remember:  Do not eat food or drink liquids after midnight.  Take these medicines the morning of surgery with A SIP OF WATER : tylenol only if you need it   Do not wear jewelry   Do not wear lotions, powders, or perfumes.  You may wear deoderant.   Do not shave 48 hours prior to surgery.  Men may shave face and neck.   Do not bring valuables to the hospital.   [facility] is not responsible for any belongings or valuables.  Contacts, dentures or bridgework may not be worn into surgery.  Leave your suitcase in the car.  After surgery it may be brought to your room.  For patients admitted to the hospital, discharge time will be determined by your treatment team.  Patients discharged the day of surgery will not be allowed to drive home.   Name and phone number of your driver:   With wife  Special instructions:  Special Instructions: [facility] - Preparing for Surgery  Before surgery, you can play an important role.  Because skin is not sterile, your skin needs to be as free of germs as possible.  You can reduce the number of germs on you skin by washing with CHG (chlorahexidine gluconate) soap before surgery.  CHG is an antiseptic cleaner which kills germs and bonds with the skin to continue killing germs even after washing.  Please DO NOT use if you have an allergy to CHG or antibacterial soaps.  If your skin becomes reddened/irritated stop using the CHG and inform your nurse when you arrive at Short Stay.  Do not shave (including legs and underarms) for at least 48 hours prior to the first CHG  shower.  You may shave your face.  Please follow these instructions carefully:   1.  Shower with CHG Soap the night before surgery and the  morning of Surgery.  2.  If you choose to wash your hair, wash your hair first as usual with your  normal shampoo.  3.  After you shampoo, rinse your hair and body thoroughly to remove the  Shampoo.  4.  Use CHG as you would any other liquid soap.  You can apply chg directly to the skin and wash gently with scrungie or a clean washcloth.  5.  Apply the CHG Soap to your body ONLY FROM THE NECK DOWN.    Do not use on open wounds or open sores.  Avoid contact with your eyes, ears, mouth and genitals (private parts).  Wash genitals (private parts)   with your normal soap.  6.  Wash thoroughly, paying special attention to the area where your surgery will be performed.  7.  Thoroughly rinse your body with warm water from the neck down.  8.  DO NOT shower/wash with your normal soap after using and rinsing off   the CHG Soap.  9.  Pat yourself dry with a clean towel.            10.  Wear clean pajamas.  11.  Place clean sheets on your bed the night of your first shower and do not sleep with pets.  Day of Surgery  Do not apply any lotions/deodorants the morning of surgery.  Please wear clean clothes to the hospital/surgery center.  Please read over the following fact sheets that you were given. Pain Booklet, Coughing and Deep Breathing and Surgical Site Infection Prevention

## 2015-09-21 NOTE — Progress Notes (Signed)
Pt. Reports that he doesn';t really have a primary doctor, uses a "doc in the box" if he needs it.  Pt.'s BP 148/100, denies ever having any BP issues, but doesn't see PCP on a regular basis & remarks several times tha he doesn;'t want to take meds.

## 2015-09-28 MED ORDER — GABAPENTIN 300 MG PO CAPS
300.0000 mg | ORAL_CAPSULE | ORAL | Status: AC
  Administered 2015-09-29: 300 mg via ORAL
  Filled 2015-09-28: qty 1

## 2015-09-28 MED ORDER — CELECOXIB 200 MG PO CAPS
400.0000 mg | ORAL_CAPSULE | ORAL | Status: AC
  Administered 2015-09-29: 400 mg via ORAL
  Filled 2015-09-28: qty 2

## 2015-09-28 MED ORDER — DEXTROSE 5 % IV SOLN
3.0000 g | INTRAVENOUS | Status: AC
  Administered 2015-09-29: 3 g via INTRAVENOUS
  Filled 2015-09-28 (×2): qty 3000

## 2015-09-28 MED ORDER — ACETAMINOPHEN 500 MG PO TABS
1000.0000 mg | ORAL_TABLET | ORAL | Status: AC
  Administered 2015-09-29: 1000 mg via ORAL
  Filled 2015-09-28: qty 2

## 2015-09-29 ENCOUNTER — Ambulatory Visit (HOSPITAL_COMMUNITY): Payer: 59 | Admitting: Anesthesiology

## 2015-09-29 ENCOUNTER — Encounter (HOSPITAL_COMMUNITY)
Admission: RE | Disposition: A | Discharge status: Home / Self Care | Payer: Self-pay | Source: Ambulatory Visit | Attending: Surgery

## 2015-09-29 ENCOUNTER — Encounter (HOSPITAL_COMMUNITY): Payer: Self-pay | Admitting: *Deleted

## 2015-09-29 ENCOUNTER — Ambulatory Visit (HOSPITAL_COMMUNITY)
Admission: RE | Admit: 2015-09-29 | Discharge: 2015-09-29 | Disposition: A | Discharge status: Home / Self Care | Payer: 59 | Source: Ambulatory Visit | Attending: Surgery | Admitting: Surgery

## 2015-09-29 DIAGNOSIS — E669 Obesity, unspecified: Secondary | ICD-10-CM | POA: Diagnosis not present

## 2015-09-29 DIAGNOSIS — M1990 Unspecified osteoarthritis, unspecified site: Secondary | ICD-10-CM | POA: Insufficient documentation

## 2015-09-29 DIAGNOSIS — I10 Essential (primary) hypertension: Secondary | ICD-10-CM | POA: Insufficient documentation

## 2015-09-29 DIAGNOSIS — Z6835 Body mass index (BMI) 35.0-35.9, adult: Secondary | ICD-10-CM | POA: Diagnosis not present

## 2015-09-29 DIAGNOSIS — K429 Umbilical hernia without obstruction or gangrene: Secondary | ICD-10-CM | POA: Insufficient documentation

## 2015-09-29 DIAGNOSIS — K439 Ventral hernia without obstruction or gangrene: Secondary | ICD-10-CM | POA: Diagnosis not present

## 2015-09-29 HISTORY — PX: VENTRAL HERNIA REPAIR: SHX424

## 2015-09-29 HISTORY — PX: INSERTION OF MESH: SHX5868

## 2015-09-29 SURGERY — REPAIR, HERNIA, VENTRAL, LAPAROSCOPIC
Anesthesia: General | Site: Abdomen

## 2015-09-29 MED ORDER — FENTANYL CITRATE (PF) 100 MCG/2ML IJ SOLN
INTRAMUSCULAR | Status: AC
  Filled 2015-09-29: qty 2

## 2015-09-29 MED ORDER — EPHEDRINE SULFATE-NACL 50-0.9 MG/10ML-% IV SOSY
PREFILLED_SYRINGE | INTRAVENOUS | Status: DC | PRN
  Administered 2015-09-29: 10 mg via INTRAVENOUS

## 2015-09-29 MED ORDER — CHLORHEXIDINE GLUCONATE CLOTH 2 % EX PADS: 6.0000 | MEDICATED_PAD | Freq: Once | CUTANEOUS | Status: DC

## 2015-09-29 MED ORDER — SUGAMMADEX SODIUM 500 MG/5ML IV SOLN
INTRAVENOUS | Status: AC
  Filled 2015-09-29: qty 5

## 2015-09-29 MED ORDER — ROCURONIUM BROMIDE 10 MG/ML (PF) SYRINGE
PREFILLED_SYRINGE | INTRAVENOUS | Status: DC | PRN
  Administered 2015-09-29: 70 mg via INTRAVENOUS
  Administered 2015-09-29: 20 mg via INTRAVENOUS
  Administered 2015-09-29: 10 mg via INTRAVENOUS

## 2015-09-29 MED ORDER — SUGAMMADEX SODIUM 200 MG/2ML IV SOLN
INTRAVENOUS | Status: DC | PRN
  Administered 2015-09-29 (×2): 100 mg via INTRAVENOUS

## 2015-09-29 MED ORDER — PHENYLEPHRINE 40 MCG/ML (10ML) SYRINGE FOR IV PUSH (FOR BLOOD PRESSURE SUPPORT)
PREFILLED_SYRINGE | INTRAVENOUS | Status: AC
  Filled 2015-09-29: qty 20

## 2015-09-29 MED ORDER — OXYCODONE-ACETAMINOPHEN 5-325 MG PO TABS: 1.0000 | ORAL_TABLET | ORAL | 0 refills | Status: DC | PRN

## 2015-09-29 MED ORDER — BUPIVACAINE-EPINEPHRINE 0.25% -1:200000 IJ SOLN
INTRAMUSCULAR | Status: DC | PRN
  Administered 2015-09-29: 30 mL

## 2015-09-29 MED ORDER — EPHEDRINE 5 MG/ML INJ
INTRAVENOUS | Status: AC
  Filled 2015-09-29: qty 10

## 2015-09-29 MED ORDER — LIDOCAINE 2% (20 MG/ML) 5 ML SYRINGE
INTRAMUSCULAR | Status: DC | PRN
  Administered 2015-09-29: 60 mg via INTRAVENOUS

## 2015-09-29 MED ORDER — PROMETHAZINE HCL 25 MG/ML IJ SOLN: 6.2500 mg | INTRAMUSCULAR | Status: DC | PRN

## 2015-09-29 MED ORDER — FENTANYL CITRATE (PF) 250 MCG/5ML IJ SOLN
INTRAMUSCULAR | Status: AC
  Filled 2015-09-29: qty 5

## 2015-09-29 MED ORDER — SODIUM CHLORIDE 0.9 % IR SOLN: Status: DC

## 2015-09-29 MED ORDER — FENTANYL CITRATE (PF) 250 MCG/5ML IJ SOLN
INTRAMUSCULAR | Status: DC | PRN
  Administered 2015-09-29 (×2): 50 ug via INTRAVENOUS
  Administered 2015-09-29: 150 ug via INTRAVENOUS
  Administered 2015-09-29 (×2): 25 ug via INTRAVENOUS

## 2015-09-29 MED ORDER — PROPOFOL 10 MG/ML IV BOLUS
INTRAVENOUS | Status: DC | PRN
  Administered 2015-09-29: 200 mg via INTRAVENOUS

## 2015-09-29 MED ORDER — FENTANYL CITRATE (PF) 100 MCG/2ML IJ SOLN
25.0000 ug | INTRAMUSCULAR | Status: DC | PRN
  Administered 2015-09-29 (×4): 25 ug via INTRAVENOUS

## 2015-09-29 MED ORDER — 0.9 % SODIUM CHLORIDE (POUR BTL) OPTIME
TOPICAL | Status: DC | PRN
  Administered 2015-09-29: 1000 mL

## 2015-09-29 MED ORDER — PHENYLEPHRINE HCL 10 MG/ML IJ SOLN
INTRAVENOUS | Status: DC | PRN
  Administered 2015-09-29: 20 ug/min via INTRAVENOUS

## 2015-09-29 MED ORDER — PHENYLEPHRINE 40 MCG/ML (10ML) SYRINGE FOR IV PUSH (FOR BLOOD PRESSURE SUPPORT)
PREFILLED_SYRINGE | INTRAVENOUS | Status: DC | PRN
  Administered 2015-09-29 (×3): 40 ug via INTRAVENOUS
  Administered 2015-09-29: 80 ug via INTRAVENOUS
  Administered 2015-09-29: 40 ug via INTRAVENOUS
  Administered 2015-09-29: 80 ug via INTRAVENOUS

## 2015-09-29 MED ORDER — MIDAZOLAM HCL 5 MG/5ML IJ SOLN
INTRAMUSCULAR | Status: DC | PRN
  Administered 2015-09-29: 2 mg via INTRAVENOUS

## 2015-09-29 MED ORDER — MIDAZOLAM HCL 2 MG/2ML IJ SOLN
INTRAMUSCULAR | Status: AC
  Filled 2015-09-29: qty 2

## 2015-09-29 MED ORDER — BUPIVACAINE-EPINEPHRINE (PF) 0.25% -1:200000 IJ SOLN
INTRAMUSCULAR | Status: AC
  Filled 2015-09-29: qty 30

## 2015-09-29 MED ORDER — LACTATED RINGERS IV SOLN
INTRAVENOUS | Status: DC
  Administered 2015-09-29 (×2): via INTRAVENOUS

## 2015-09-29 SURGICAL SUPPLY — 53 items
BINDER ABD UNIV 12 45-62 (WOUND CARE) ×1 IMPLANT
BINDER ABDOMINAL 46IN 62IN (WOUND CARE) ×3
BLADE SURG ROTATE 9660 (MISCELLANEOUS) ×3 IMPLANT
CHLORAPREP W/TINT 26ML (MISCELLANEOUS) ×3 IMPLANT
COVER SURGICAL LIGHT HANDLE (MISCELLANEOUS) ×3 IMPLANT
DEVICE TROCAR PUNCTURE CLOSURE (ENDOMECHANICALS) ×3 IMPLANT
DRAIN CHANNEL 15F RND FF W/TCR (WOUND CARE) ×3 IMPLANT
DRAPE LAPAROSCOPIC ABDOMINAL (DRAPES) ×3 IMPLANT
DRAPE WARM FLUID 44X44 (DRAPE) ×3 IMPLANT
ELECT CAUTERY BLADE 6.4 (BLADE) ×3 IMPLANT
ELECT REM PT RETURN 9FT ADLT (ELECTROSURGICAL) ×3
ELECTRODE REM PT RTRN 9FT ADLT (ELECTROSURGICAL) ×1 IMPLANT
EVACUATOR SILICONE 100CC (DRAIN) ×3 IMPLANT
GLOVE BIO SURGEON STRL SZ 6 (GLOVE) ×3 IMPLANT
GLOVE BIO SURGEON STRL SZ8 (GLOVE) ×3 IMPLANT
GLOVE BIOGEL PI IND STRL 6 (GLOVE) ×1 IMPLANT
GLOVE BIOGEL PI IND STRL 7.5 (GLOVE) ×1 IMPLANT
GLOVE BIOGEL PI IND STRL 8 (GLOVE) ×1 IMPLANT
GLOVE BIOGEL PI INDICATOR 6 (GLOVE) ×2
GLOVE BIOGEL PI INDICATOR 7.5 (GLOVE) ×2
GLOVE BIOGEL PI INDICATOR 8 (GLOVE) ×2
GLOVE ECLIPSE 7.5 STRL STRAW (GLOVE) ×3 IMPLANT
GOWN STRL REUS W/ TWL LRG LVL3 (GOWN DISPOSABLE) ×2 IMPLANT
GOWN STRL REUS W/ TWL XL LVL3 (GOWN DISPOSABLE) ×1 IMPLANT
GOWN STRL REUS W/TWL LRG LVL3 (GOWN DISPOSABLE) ×4
GOWN STRL REUS W/TWL XL LVL3 (GOWN DISPOSABLE) ×2
KIT BASIN OR (CUSTOM PROCEDURE TRAY) ×3 IMPLANT
KIT ROOM TURNOVER OR (KITS) ×3 IMPLANT
LIQUID BAND (GAUZE/BANDAGES/DRESSINGS) ×3 IMPLANT
MARKER SKIN DUAL TIP RULER LAB (MISCELLANEOUS) ×3 IMPLANT
MESH VENTRALEX ST 1-7/10 CRC S (Mesh General) ×3 IMPLANT
MESH VENTRALEX ST 2.5 CRC MED (Mesh General) ×3 IMPLANT
NEEDLE SPNL 22GX3.5 QUINCKE BK (NEEDLE) ×3 IMPLANT
NS IRRIG 1000ML POUR BTL (IV SOLUTION) ×3 IMPLANT
PAD ARMBOARD 7.5X6 YLW CONV (MISCELLANEOUS) ×6 IMPLANT
PENCIL BUTTON HOLSTER BLD 10FT (ELECTRODE) ×3 IMPLANT
SCALPEL HARMONIC ACE (MISCELLANEOUS) ×3 IMPLANT
SCISSORS LAP 5X35 DISP (ENDOMECHANICALS) ×3 IMPLANT
SLEEVE ENDOPATH XCEL 5M (ENDOMECHANICALS) ×3 IMPLANT
SPECIMEN JAR MEDIUM (MISCELLANEOUS) ×3 IMPLANT
SUT ETHILON 3 0 FSL (SUTURE) ×3 IMPLANT
SUT MON AB 4-0 PC3 18 (SUTURE) ×3 IMPLANT
SUT NOVA NAB DX-16 0-1 5-0 T12 (SUTURE) ×9 IMPLANT
SUT VIC AB 3-0 SH 18 (SUTURE) IMPLANT
SUT VICRYL 0 UR6 27IN ABS (SUTURE) ×3 IMPLANT
TOWEL OR 17X26 10 PK STRL BLUE (TOWEL DISPOSABLE) ×3 IMPLANT
TRAY FOLEY CATH 16FR SILVER (SET/KITS/TRAYS/PACK) ×3 IMPLANT
TRAY LAPAROSCOPIC MC (CUSTOM PROCEDURE TRAY) ×3 IMPLANT
TROCAR XCEL NON-BLD 5MMX100MML (ENDOMECHANICALS) ×3 IMPLANT
TUBE CONNECTING 12'X1/4 (SUCTIONS)
TUBE CONNECTING 12X1/4 (SUCTIONS) IMPLANT
TUBING INSUFFLATION (TUBING) ×3 IMPLANT
YANKAUER SUCT BULB TIP NO VENT (SUCTIONS) IMPLANT

## 2015-09-29 NOTE — Progress Notes (Signed)
Patient tolerated I/O cath well, 700cc urine drained from bladder.  Patient stated relief.  Discharge instructions reviewed with patient and wife and they were instructed to seek medical attention if he is unable to void on his own after 8 hours.

## 2015-09-29 NOTE — Discharge Instructions (Signed)
[facility] _______[facility] ° °UMBILICAL OR INGUINAL HERNIA REPAIR: POST OP INSTRUCTIONS ° °Always review your discharge instruction sheet given to you by the facility where your surgery was performed. °IF YOU HAVE DISABILITY OR FAMILY LEAVE FORMS, YOU MUST BRING THEM TO THE OFFICE FOR PROCESSING.   °DO NOT GIVE THEM TO YOUR DOCTOR. ° °1. A  prescription for pain medication may be given to you upon discharge.  Take your pain medication as prescribed, if needed.  If narcotic pain medicine is not needed, then you may take acetaminophen (Tylenol) or ibuprofen (Advil) as needed. °2. Take your usually prescribed medications unless otherwise directed. °3. If you need a refill on your pain medication, please contact your pharmacy.  They will contact our office to request authorization. Prescriptions will not be filled after 5 pm or on week-ends. °4. You should follow a light diet the first 24 hours after arrival home, such as soup and crackers, etc.  Be sure to include lots of fluids daily.  Resume your normal diet the day after surgery. °5. Most patients will experience some swelling and bruising around the umbilicus or in the groin and scrotum.  Ice packs and reclining will help.  Swelling and bruising can take several days to resolve.  °6. It is common to experience some constipation if taking pain medication after surgery.  Increasing fluid intake and taking a stool softener (such as Colace) will usually help or prevent this problem from occurring.  A mild laxative (Milk of Magnesia or Miralax) should be taken according to package directions if there are no bowel movements after 48 hours. °7. Unless discharge instructions indicate otherwise, you may remove your bandages 24-48 hours after surgery, and you may shower at that time.  You may have steri-strips (small skin tapes) in place directly over the incision.  These strips should be left on the skin for 7-10 days.  If your surgeon used skin glue on the  incision, you may shower in 24 hours.  The glue will flake off over the next 2-3 weeks.  Any sutures or staples will be removed at the office during your follow-up visit. °8. ACTIVITIES:  You may resume regular (light) daily activities beginning the next day--such as daily self-care, walking, climbing stairs--gradually increasing activities as tolerated.  You may have sexual intercourse when it is comfortable.  Refrain from any heavy lifting or straining until approved by your doctor. °a. You may drive when you are no longer taking prescription pain medication, you can comfortably wear a seatbelt, and you can safely maneuver your car and apply brakes. °b. RETURN TO WORK:  __________________________________________________________ °9. You should see your doctor in the office for a follow-up appointment approximately 2-3 weeks after your surgery.  Make sure that you call for this appointment within a day or two after you arrive home to insure a convenient appointment time. °10. OTHER INSTRUCTIONS:  __________________________________________________________________________________________________________________________________________________________________________________________  °WHEN TO CALL YOUR DOCTOR: °1. Fever over 101.0 °2. Inability to urinate °3. Nausea and/or vomiting °4. Extreme swelling or bruising °5. Continued bleeding from incision. °6. Increased pain, redness, or drainage from the incision ° °The clinic staff is available to answer your questions during regular business hours.  Please don’t hesitate to call and ask to speak to one of the nurses for clinical concerns.  If you have a medical emergency, go to the nearest emergency room or call 911.  A surgeon from [facility] is always on call at the hospital ° ° °  [address] [place], [place], [place]  [address] ?  [address], [place], [place]   [address] [phone] ? [phone] ? FAX [phone] Web site:  [email or web address]       Bulb Drain Home Care A bulb drain consists of a thin rubber tube and a soft, round bulb that creates a gentle suction. The rubber tube is placed in the area where you had surgery. A bulb is attached to the end of the tube that is outside the body. The bulb drain removes excess fluid that normally builds up in a surgical wound after surgery. The color and amount of fluid will vary. Immediately after surgery, the fluid is bright red and is a little thicker than water. It may gradually change to a yellow or pink color and become more thin and water-like. When the amount decreases to about 1 or 2 tbsp in 24 hours, your health care provider will usually remove it. DAILY CARE  Keep the bulb flat (compressed) at all times, except while emptying it. The flatness creates suction. You can flatten the bulb by squeezing it firmly in the middle and then closing the cap.  Keep sites where the tube enters the skin dry and covered with a bandage (dressing).  Secure the tube 1-2 in (2.5-5.1 cm) below the insertion sites to keep it from pulling on your stitches. The tube is stitched in place and will not slip out.  Secure the bulb as directed by your health care provider.  For the first 3 days after surgery, there usually is more fluid in the bulb. Empty the bulb whenever it becomes half full because the bulb does not create enough suction if it is too full. The bulb could also overflow. Write down how much fluid you remove each time you empty your drain. Add up the amount removed in 24 hours.  Empty the bulb at the same time every day once the amount of fluid decreases and you only need to empty it once a day. Write down the amounts and the 24-hour totals to give to your health care provider. This helps your health care provider know when the tubes can be removed. EMPTYING THE BULB DRAIN Before emptying the bulb, get a measuring cup, a piece of paper and a pen, and wash  your hands.  Gently run your fingers down the tube (stripping) to empty any drainage from the tubing into the bulb. This may need to be done several times a day to clear the tubing of clots and tissue.  Open the bulb cap to release suction, which causes it to inflate. Do not touch the inside of the cap.  Gently run your fingers down the tube (stripping) to empty any drainage from the tubing into the bulb.  Hold the cap out of the way, and pour fluid into the measuring cup.   Squeeze the bulb to provide suction.  Replace the cap.   Check the tape that holds the tube to your skin. If it is becoming loose, you can remove the loose piece of tape and apply a new one. Then, pin the bulb to your shirt.   Write down the amount of fluid you emptied out. Write down the date and each time you emptied your bulb drain. (If there are 2 bulbs, note the amount of drainage from each bulb and keep the totals separate. Your health care provider will want to know the total amounts for each drain and which tube is draining  more.)   Flush the fluid down the toilet and wash your hands.   Call your health care provider once you have less than 2 tbsp of fluid collecting in the bulb drain every 24 hours. If there is drainage around the tube site, change dressings and keep the area dry. Cleanse around tube with sterile saline and place dry gauze around site. This gauze should be changed when it is soiled. If it stays clean and unsoiled, it should still be changed daily.  SEEK MEDICAL CARE IF:  Your drainage has a bad smell or is cloudy.   You have a fever.   Your drainage is increasing instead of decreasing.   Your tube fell out.   You have redness or swelling around the tube site.   You have drainage from a surgical wound.   Your bulb drain will not stay flat after you empty it.  MAKE SURE YOU:   Understand these instructions.  Will watch your condition.  Will get help right away if you  are not doing well or get worse.   This information is not intended to replace advice given to you by your health care provider. Make sure you discuss any questions you have with your health care provider.   Document Released: 02/03/2000 Document Revised: 02/26/2014 Document Reviewed: 08/25/2014 Elsevier Interactive Patient Education [employer].

## 2015-09-29 NOTE — Interval H&P Note (Signed)
History and Physical Interval Note:  09/29/2015 11:31 AM  [name] [name]  has presented today for surgery, with the diagnosis of ventral hernia  The various methods of treatment have been discussed with the patient and family. After consideration of risks, benefits and other options for treatment, the patient has consented to  Procedure(s): LAPAROSCOPIC VENTRAL HERNIA (N/A) INSERTION OF MESH (N/A) as a surgical intervention .  The patient's history has been reviewed, patient examined, no change in status, stable for surgery.  I have reviewed the patient's chart and labs.  Questions were answered to the patient's satisfaction.     Tarisa Paola A.

## 2015-09-29 NOTE — Anesthesia Preprocedure Evaluation (Addendum)
Anesthesia Evaluation  Patient identified by MRN, date of birth, ID band Patient awake    Reviewed: Allergy & Precautions, NPO status , Patient's Chart, lab work & pertinent test results  History of Anesthesia Complications Negative for: history of anesthetic complications  Airway Mallampati: II  TM Distance: >3 FB Neck ROM: Full    Dental  (+) Dental Advisory Given,    Pulmonary neg pulmonary ROS,    Pulmonary exam normal breath sounds clear to auscultation       Cardiovascular Exercise Tolerance: Good hypertension, (-) angina(-) Past MI, (-) Cardiac Stents, (-) CABG, (-) Orthopnea and (-) DOE  Rhythm:Regular Rate:Normal     Neuro/Psych PSYCHIATRIC DISORDERS Anxiety negative neurological ROS     GI/Hepatic negative GI ROS, Neg liver ROS, neg GERD  ,  Endo/Other  negative endocrine ROS  Renal/GU Renal disease (h/o nephrolithiasis)     Musculoskeletal  (+) Arthritis ,   Abdominal (+) + obese,   Peds  Hematology negative hematology ROS (+)   Anesthesia Other Findings   Reproductive/Obstetrics                            Anesthesia Physical Anesthesia Plan  ASA: II  Anesthesia Plan: General   Post-op Pain Management:    Induction: Intravenous  Airway Management Planned: Oral ETT  Additional Equipment:   Intra-op Plan:   Post-operative Plan: Extubation in OR  Informed Consent: I have reviewed the patients History and Physical, chart, labs and discussed the procedure including the risks, benefits and alternatives for the proposed anesthesia with the patient or authorized representative who has indicated his/her understanding and acceptance.   Dental advisory given  Plan Discussed with:   Anesthesia Plan Comments:        Anesthesia Quick Evaluation

## 2015-09-29 NOTE — Progress Notes (Signed)
Patient attempted to void again unsuccessfully.  Able to ambulate without problem.  Wishes to try again on his own prior to further intervention.

## 2015-09-29 NOTE — Anesthesia Postprocedure Evaluation (Signed)
Anesthesia Post Note  Patient: [name] [name]  Procedure(s) Performed: Procedure(s) (LRB): LAPAROSCOPIC VENTRAL HERNIA (N/A) INSERTION OF MESH (N/A)  Patient location during evaluation: PACU Anesthesia Type: General Level of consciousness: awake and alert Pain management: pain level controlled Vital Signs Assessment: post-procedure vital signs reviewed and stable Respiratory status: spontaneous breathing, nonlabored ventilation, respiratory function stable and patient connected to nasal cannula oxygen Cardiovascular status: blood pressure returned to baseline and stable Postop Assessment: no signs of nausea or vomiting Anesthetic complications: no    Last Vitals:  Vitals:   09/29/15 1525 09/29/15 1555  BP:  135/81  Pulse: 87   Resp: 11 10  Temp:      Last Pain:  Vitals:   09/29/15 1500  TempSrc:   PainSc: 7                  Symia Herdt,W. EDMOND

## 2015-09-29 NOTE — Op Note (Signed)
[name] [name], [name] [name]            ACCOUNT NO.:  [id number]  MEDICAL RECORD NO.:  [id number]  LOCATION:  [facility]                         FACILITY:  [facility]  PHYSICIAN:  Jetta Murray A. Sugar Vanzandt, M.D.DATE OF BIRTH:  Apr 16, 1970  DATE OF PROCEDURE:  09/29/2015 DATE OF DISCHARGE:                              OPERATIVE REPORT   PREOPERATIVE DIAGNOSES: 1. Ventral hernia. 2. Umbilical hernia.  POSTOPERATIVE DIAGNOSES: 1. Ventral hernia. 2. Umbilical hernia.  PROCEDURE: 1. Diagnostic laparoscopy. 2. Open repair of ventral hernia and umbilical hernia with mesh.  SURGEON:  [name] A. Charleton Deyoung, M.D.  ANESTHESIA:  General endotracheal anesthesia with 0.25% Sensorcaine local with epinephrine.  EBL:  Minimal.  SPECIMEN:  Hernia sac to pathology.  DRAINS:  A 15 round Blake drain to subcutaneous pocket, abdominal subcutaneous tissues.  INDICATIONS FOR PROCEDURE:  The patient is a 45 year old male with a history of open cholecystectomy last year.  He developed a lower ventral hernia separate from his incision.  He states he has had it for many years, but it has gotten larger and more painful.  He was seen in the emergency room back in June of 2017 and found to have an incarcerated piece of omentum in this.  He has a small umbilical hernia as well.  He desired repair of both.  All for laparoscopy to further define his anatomy and repair it laparoscopically if so indicated.  These were very small.  I offered small open repairs to him depending on the anatomy once we get in there.  Discussed risk of surgery of bleeding, infection, bowel injury, bladder injury, injury to major blood vessels, the need for open surgery, sepsis, death, DVT, the need for recurrent repair if repair is failed.  After lengthy discussion, he agreed to proceed.  DESCRIPTION OF PROCEDURE:  The patient was met in the holding area and questions were answered.  He was taken back to the operating room and placed supine on the OR  table.  After general anesthesia induction, both arms were tucked.  The abdomen was prepped and draped in sterile fashion, and he received appropriate preoperative antibiotics.  Proper patient and procedure were verified.  In the left upper quadrant, a 1 cm incision was made, and a 5-mm Optiview port was placed and this was advanced into the abdominal cavity without difficulty visualizing all layers.  Pneumoperitoneum was created using 15 mmHg of CO2.  Laparoscope was placed.  There were some omental adhesions where the hernia defect was then from his open cholecystectomy.  A second left lower quadrant 5 mm port was placed.  We began dissection of the omentum off the anterior abdominal wall until we encountered the hernia defect which was well below the previous open cholecystectomy incision.  Hernia defect measured roughly 1.5 x 1.5 cm.  Of note, he had a very small 1 cm umbilical hernia as well.  The abdominal wall between these areas was completely normal.  There was no evidence of any diastasis recti or significant thinning of the abdominal wall.  I felt that 2 small open repairs would suffice in this circumstance given the small nature of these hernias.  I used a spinal needle to mark the ventral hernia to find  the actual defect.  There was a large amount of fatty tissue and this had reduced out.  I then made open incision of the defect. Dissection was carried down, and the 1 cm defect was encountered. Within the subcutaneous tissues, it was an 8 x 8 cm hernia sac/mass. This appeared to be either chronically incarcerated omentum that had infarcted or separate mass.  I removed all this tissue and sent to pathology.  This left a sizable cavity.  I then repaired the small hernia defect with an 8.6 cm circular patch which was bio-coated.  The coated side was placed into the abdominal cavity.  The mesh was placed in the subfascial position and secured to the fascia with interrupted  #1 Novafil.  The small defect was closed itself with #1 Novafil.  The umbilical hernia was then repaired.  A 4.3 cm patch was used for the umbilical hernia.  Curvilinear incision was made along the base of the umbilicus.  Dissection was carried and the defect was identified.  It was a 1 cm defect.  The hernia sac was reduced back into the abdominal cavity which contained no intraabdominal contents.  The patch was then placed in the submuscular position and secured to the fascia with #1 Novafil.  We then closed the subcu tissues of both with 3-0 Vicryl.  I then re-insufflated the abdominal cavity to reinspect our repairs.  Both repairs had significant at least 3-4 cm of overlap around the hernia defect.  I reinspected the intestinal contents, there was no evidence of injury to the small bowel, colon, or any of the other intraabdominal organs that I could see with complete circumferential examination.  The umbilical hernia site looked good as well.  We then removed the ports allowing the CO2 to escape.  4-0 Monocryl was used to close the skin in a subcuticular fashion.  A drain was placed where the ventral hernia subcutaneous pocket was placed.  This was secured with 2-0 nylon and placed to bulb suction.  Glue was applied to all incisions.  Abdominal binder was placed.  All final counts were found to be correct of sponge, needles, and instruments.  The patient was awoken, extubated, and taken to recovery in satisfactory condition.     [name] [name], M.D.     TAC/MEDQ  D:  09/29/2015  T:  09/29/2015  Job:  [id number]

## 2015-09-29 NOTE — Anesthesia Procedure Notes (Signed)
Procedure Name: Intubation Date/Time: 09/29/2015 12:22 PM Performed by: Merdis Delay Pre-anesthesia Checklist: Patient identified, Suction available, Emergency Drugs available, Patient being monitored and Timeout performed Patient Re-evaluated:Patient Re-evaluated prior to inductionOxygen Delivery Method: Circle system utilized Preoxygenation: Pre-oxygenation with 100% oxygen Intubation Type: IV induction Ventilation: Mask ventilation without difficulty, Oral airway inserted - appropriate to patient size and Two handed mask ventilation required Laryngoscope Size: Mac and 4 Grade View: Grade II Tube type: Oral Tube size: 7.5 mm Number of attempts: 1 Airway Equipment and Method: Stylet Placement Confirmation: ETT inserted through vocal cords under direct vision,  CO2 detector,  breath sounds checked- equal and bilateral and positive ETCO2 Secured at: 22 cm Tube secured with: Tape Dental Injury: Teeth and Oropharynx as per pre-operative assessment  Comments: Able to mask ventilate with one hand- 2 handed better

## 2015-09-29 NOTE — Progress Notes (Addendum)
Patient has attempted to void twice more on his own without success.  Bladder scanned earlier for >500cc urine. Dr. Grandville Silos paged ang gave orders for I/O cath and discharge home afterwards.

## 2015-09-29 NOTE — Brief Op Note (Signed)
09/29/2015  2:04 PM  PATIENT:  [name] [name]  45 y.o. male  PRE-OPERATIVE DIAGNOSIS:  ventral hernia  POST-OPERATIVE DIAGNOSIS:  ventral hernia  PROCEDURE:  Procedure(s): LAPAROSCOPIC VENTRAL HERNIA (N/A) INSERTION OF MESH (N/A)  SURGEON:  Surgeon(s) and Role:    [name], MD - Primary     ASSISTANTS: none   ANESTHESIA:   local and general  EBL:  Total I/O In: 1500 [I.V.:1500] Out: 50 [Urine:25; Blood:25]  BLOOD ADMINISTERED:none  DRAINS: (15) Jackson-Pratt drain(s) with closed bulb suction in the SUBCUTANEOUS SPACE    LOCAL MEDICATIONS USED:  BUPIVICAINE   SPECIMEN:  Source of Specimen:  HERNIA SAC  DISPOSITION OF SPECIMEN:  PATHOLOGY  COUNTS:  YES  TOURNIQUET:  * No tourniquets in log *  DICTATION: .Other Dictation: Dictation Number [id number]  PLAN OF CARE: Discharge to home after PACU  PATIENT DISPOSITION:  PACU - hemodynamically stable.   Delay start of Pharmacological VTE agent (>24hrs) due to surgical blood loss or risk of bleeding: no

## 2015-09-29 NOTE — H&P (Signed)
H&P Encounter Date: 08/19/2015 [name] Luna, MD  Surgery    [name] [name] 08/19/2015 11:17 AM Location: [facility] Patient #: [id number] DOB: [birth year] Single / Language: [name] [name] / Race: White Male  History of Present Illness [name] [name] A. [name] Burkhead MD;  PM) Patient words: f/u hernia/CT done at ED    Patient returns for follow-up after being seen in the emergency room for abdominal pain and pre-existing history of a ventral hernia. He is almost 19 months out from an open cholecystectomy. He had a known lower abdominal wall hernia present since he was a small child. 3 weeks ago he developed pain at the site as CT scanning showed omental fat within the hernia which had gotten larger. It was about 3-4 cm in maximal diameter. He feels well today but he does have intermittent pain from the hernia and desires repair at this point. He is having no significant nausea vomiting diarrhea or constipation. He is not requiring any change in activity for the most part.                    CLINICAL DATA: Umbilical pain and hernia with nausea and emesis since this evening.  EXAM: CT ABDOMEN AND PELVIS WITH CONTRAST  TECHNIQUE: Multidetector CT imaging of the abdomen and pelvis was performed using the standard protocol following bolus administration of intravenous contrast.  CONTRAST: 1 ISOVUE-300 IOPAMIDOL (ISOVUE-300) INJECTION 61%  COMPARISON: 02/05/2014  FINDINGS: Atelectasis in the lung bases.  Mild diffuse fatty infiltration of the liver. Scattered low-attenuation lesions measuring about a cm diameter. These are likely cysts. No change since prior study. Surgical absence of the gallbladder. No bile duct dilatation. Pancreas, spleen, adrenal glands, kidneys, abdominal aorta, inferior vena cava, and retroperitoneal lymph nodes are unremarkable. Ventral upper abdominal hernia containing fat with developing infiltration in  the herniated fat suggesting fat necrosis. No bowel herniation. Small periumbilical hernia containing fat. Stomach, small bowel, and colon are not abnormally distended. No free air or free fluid in the abdomen.  Pelvis: Prostate gland is not enlarged. Bladder wall is not thickened. No free or loculated pelvic fluid collections. Appendix is normal. No pelvic mass or lymphadenopathy. Degenerative changes in the spine. No destructive bone lesions. Focal sclerosis in the left femoral neck likely representing a benign bone island. No change since prior study.  IMPRESSION: Ventral upper abdominal wall hernia containing fat with stranding in the herniated fat consistent with fat necrosis. Fat necrosis is new since previous study. No bowel herniation or obstruction. Fatty infiltration of the liver.   Electronically Signed By: [name] [name] M.D.  The patient is a 45 year old male.   Problem List/Past Medical [name] [name], CMA; 11:18 AM) POST-OPERATIVE STATE [phone]) VENTRAL HERNIA (K43.9)  Other Problems [name] [name], CMA; 11:18 AM) Cholelithiasis Kidney Stone Other disease, cancer, significant illness Pancreatitis  Past Surgical History [name] [name], CMA;   Gallbladder Surgery - Open  Diagnostic Studies History [name] [name], [place]; 08/19/2015 11:18 AM) Colonoscopy never  Allergies [name] [name], [place];   No Known Drug Allergies 02/23/2014  Medication History [name] [name], [place];  No Current Medications  Social History [name] [name], [place];  Alcohol use Occasional alcohol use. Caffeine use Carbonated beverages, Tea. No drug use Tobacco use Never smoker.  Family History [name] [name], [place];  11:18 AM) First Degree Relatives No pertinent family history    Vitals [name] [name] CMA;  11:17 AM) 08/19/2015 11:17 AM Weight: 261.38 lb Height: 72in  Body Surface Area: 2.39 m Body Mass  Index: 35.45 kg/m  BP: 130/86 (Sitting, Left Arm, Standard)      Physical Exam ([name] Rash A. [name] Lubke MD; 12:52 PM)  General Mental Status-Alert. General Appearance-Consistent with stated age. Hydration-Well hydrated. Voice-Normal.  Head and Neck Head-normocephalic, atraumatic with no lesions or palpable masses.  Chest and Lung Exam Chest and lung exam reveals -quiet, even and easy respiratory effort with no use of accessory muscles and on auscultation, normal breath sounds, no adventitious sounds and normal vocal resonance. Inspection Chest Wall - Normal. Back - normal.  Cardiovascular Cardiovascular examination reveals -on palpation PMI is normal in location and amplitude, no palpable S3 or S4. Normal cardiac borders., normal heart sounds, regular rate and rhythm with no murmurs, carotid auscultation reveals no bruits and normal pedal pulses bilaterally.  Abdomen Note: Right subcostal incision noted. Below that is a 5 cm round fullness consistent with hernia with incarceration of omental fat as per CT scan. There is no redness. There is mild tenderness to palpation over this. The remainder of his abdominal exam is benign. Hernia is not reducible.  Neurologic Neurologic evaluation reveals -alert and oriented x 3 with no impairment of recent or remote memory. Mental Status-Normal.  Musculoskeletal Normal Exam - Left-Upper Extremity Strength Normal and Lower Extremity Strength Normal. Normal Exam - Right-Upper Extremity Strength Normal, Lower Extremity Weakness.    Assessment & Plan ([name] Sabatino A. [name] Alarie Mabry MD; PM)  VENTRAL HERNIA (K43.9) Impression: Discussed laparoscopic and open repairs with him. Discussed the use of mesh. Discussed potential complications of each approach and hernia surgery in general. He desires laparoscopic repair of ventral hernia with mesh. The risk of hernia repair include bleeding, infection, organ injury, bowel  injury, bladder injury, nerve injury recurrent hernia, blood clots, worsening of underlying condition, chronic pain, mesh use, open surgery, death, and the need for other operattions. Pt agrees to proceed  Current Plans You are being scheduled for surgery - Our schedulers will call you.  You should hear from our office's scheduling department within 5 working days about the location, date, and time of surgery. We try to make accommodations for patient's preferences in scheduling surgery, but sometimes the OR schedule or the surgeon's schedule prevents [place] from making those accommodations.  If you have not heard from our office [phone]) in 5 working days, call the office and ask for your surgeon's nurse.  If you have other questions about your diagnosis, plan, or surgery, call the office and ask for your surgeon's nurse.  Pt Education - Pamphlet Given - Hernia Surgery: discussed with patient and provided information. The anatomy & physiology of the abdominal wall was discussed. The pathophysiology of hernias was discussed. Natural history risks without surgery including progeressive enlargement, pain, incarceration, & strangulation was discussed. Contributors to complications such as smoking, obesity, diabetes, prior surgery, etc were discussed.  I feel the risks of no intervention will lead to serious problems that outweigh the operative risks; therefore, I recommended surgery to reduce and repair the hernia. I explained laparoscopic techniques with possible need for an open approach. I noted the probable use of mesh to patch and/or buttress the hernia repair  Risks such as bleeding, infection, abscess, need for further treatment, heart attack, death, and other risks were discussed. I noted a good likelihood this will help address the problem. Goals of post-operative recovery were discussed as well. Possibility that this will not correct all symptoms was explained. I stressed the  importance of low-impact activity, aggressive pain  control, avoiding constipation, & not pushing through pain to minimize risk of post-operative chronic pain or injury. Possibility of reherniation especially with smoking, obesity, diabetes, immunosuppression, and other health conditions was discussed. We will work to minimize complications.  An educational handout further explaining the pathology & treatment options was given as well. Questions were answered. The patient expresses understanding & wishes to proceed with surgery.  Started Norco 5-325MG , 1 (one) Tablet four times daily, as needed, #20, , No Refill.       Pre-op/Pre-procedure Orders on 08/19/2015        Routing History        Detailed Report

## 2015-09-29 NOTE — Transfer of Care (Signed)
Immediate Anesthesia Transfer of Care Note  Patient: Enrigue Saulnier Barnier  Procedure(s) Performed: Procedure(s): LAPAROSCOPIC VENTRAL HERNIA (N/A) INSERTION OF MESH (N/A)  Patient Location: PACU  Anesthesia Type:General  Level of Consciousness: oriented and patient cooperative, drowsy  Airway & Oxygen Therapy: Patient Spontanous Breathing and Patient connected to nasal cannula oxygen  Post-op Assessment: Report given to RN and Post -op Vital signs reviewed and stable  Post vital signs: Reviewed and stable  Last Vitals:  Vitals:   09/29/15 1027 09/29/15 1040  BP: (!) 179/107 (!) 161/87  Pulse:    Resp:    Temp:      Last Pain:  Vitals:   09/29/15 1021  TempSrc: Oral  PainSc: 3       Patients Stated Pain Goal: 2 ([id number])  Complications: No apparent anesthesia complications

## 2015-09-30 ENCOUNTER — Encounter (HOSPITAL_COMMUNITY): Payer: Self-pay | Admitting: Surgery

## 2016-06-05 ENCOUNTER — Ambulatory Visit (INDEPENDENT_AMBULATORY_CARE_PROVIDER_SITE_OTHER): Payer: Worker's Compensation | Admitting: Family Medicine

## 2016-06-05 ENCOUNTER — Ambulatory Visit (INDEPENDENT_AMBULATORY_CARE_PROVIDER_SITE_OTHER): Payer: Worker's Compensation

## 2016-06-05 DIAGNOSIS — M5416 Radiculopathy, lumbar region: Secondary | ICD-10-CM

## 2016-06-05 DIAGNOSIS — M5417 Radiculopathy, lumbosacral region: Secondary | ICD-10-CM

## 2016-06-05 DIAGNOSIS — M25562 Pain in left knee: Secondary | ICD-10-CM

## 2016-06-05 MED ORDER — MELOXICAM 15 MG PO TABS: 15.0000 mg | ORAL_TABLET | Freq: Every day | ORAL | 2 refills | Status: DC

## 2016-06-05 NOTE — Assessment & Plan Note (Addendum)
Concern for medial meniscus tear that occurred from a work related injury, could be OA.  X-rays show spurring, but were not weight bearing, so unsure joint space.  Offered injection, but would like to do mobic.  Will see ortho for injection and possible MRI.

## 2016-06-05 NOTE — Patient Instructions (Signed)
     IF you received an x-ray today, you will receive an invoice from [facility]. Please contact  Radiology at [phone] with questions or concerns regarding your invoice.   IF you received labwork today, you will receive an invoice from [facility]. Please contact [facility] at [phone] with questions or concerns regarding your invoice.   Our billing staff will not be able to assist you with questions regarding bills from these companies.  You will be contacted with the lab results as soon as they are available. The fastest way to get your results is to activate your My Chart account. Instructions are located on the last page of this paperwork. If you have not heard from us regarding the results in 2 weeks, please contact this office.     

## 2016-06-05 NOTE — Progress Notes (Signed)
  [name] [name] - 46 y.o. male MRN [id number]  Date of birth: 10/24/1970  SUBJECTIVE:  Including CC & ROS.  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  Slipped on plastic, fell backwards and hit small of back while at work.  He has been having low back pain with left sided radiculopathy.  It does not go all the way down his left leg, but most of the way.  It has been ongoing since this occurred 2 weeks ago.  Has not changed with tylenol.  He has no numbness or tingling.   He also complains of left knee pain that began after his fall.  He has noticed swelling to the area.  He has had difficulty ambulating.  No catching, locking, buckling.  He has right knee pain as well at times, but not nearly as bad as left.     ROS: No unexpected weight loss, fever, chills, swelling, instability, muscle pain, numbness/tingling, redness, otherwise see HPI   PMHx - Updated and reviewed.  Contributory factors include: Negative PSHx - Updated and reviewed.  Contributory factors include:  Gallbladder and hernia surgery FHx - Updated and reviewed.  Contributory factors include:  Negative Social Hx - Updated and reviewed. Contributory factors include: Negative Medications - reviewed   DATA REVIEWED: none  PHYSICAL EXAM:  VS: BP:140/88  HR:(!) 104bpm  TEMP:98.1 F (36.7 C)(Oral)  RESP:95 %  HT:5\' 11"  (180.3 cm)   WT:277 lb (125.6 kg)  BMI:38.7 PHYSICAL EXAM: Gen: NAD, alert, cooperative with exam, well-appearing HEENT: clear conjunctiva,  CV:  no edema, capillary refill brisk, normal rate Resp: non-labored Skin: no rashes, normal turgor  Neuro: no gross deficits.  Psych:  alert and oriented Back Exam:  Inspection: Unremarkable  Palpable tenderness: Left paraspinal tenderness. Leg strength: Quad: 5/5 Hamstring: 5/5 Hip flexor: 5/5 Hip abductors: 5/5  Strength at foot: Plantar-flexion: 5/5 Dorsi-flexion: 5/5 Eversion: 5/5 Inversion: 5/5  Sensory  change: Gross sensation intact to all lumbar and sacral dermatomes.  Reflexes: 2+ at both patellar tendons, 2+ at achilles tendons, Babinski's downgoing.  Gait unremarkable. SLR laying: Negative  XSLR laying: Negative  FABER: negative.  Knee: Inspection with no erythema or obvious bony abnormalities. +1 effusion on left knee Palpation with no warmth, patellar tenderness, or condyle tenderness. + joint line tenderness on medial left knee ROM full in flexion and extension and lower leg rotation. Ligaments with solid consistent endpoints including ACL, PCL, LCL, MCL. +Mcmurray's on left Non painful patellar compression. Patellar glide without crepitus. Patellar and quadriceps tendons unremarkable. Hamstring and quadriceps strength is normal.     ASSESSMENT & PLAN:   Lumbar back pain with radiculopathy affecting left lower extremity Cannot do prednisone, he denies allergy but does not tolerate.  Will do mobic, PT.  He can consider seeing a chiropracter.  Referral to ortho to see if needs further intervention.  Consider MRI if not improved with mobic/PT.    Left knee pain Concern for medial meniscus tear that occurred from a work related injury, could be OA.  X-rays show spurring, but were not weight bearing, so unsure joint space.  Offered injection, but would like to do mobic.  Will see ortho for injection and possible MRI.    Signed,  Balinda Quails, DO [facility] 8:41 PM

## 2016-06-05 NOTE — Assessment & Plan Note (Signed)
Cannot do prednisone, he denies allergy but does not tolerate.  Will do mobic, PT.  He can consider seeing a chiropracter.  Referral to ortho to see if needs further intervention.  Consider MRI if not improved with mobic/PT.

## 2016-08-13 ENCOUNTER — Ambulatory Visit: Payer: 59 | Admitting: Adult Health

## 2016-10-24 ENCOUNTER — Encounter: Payer: Self-pay | Admitting: Adult Health

## 2016-10-24 ENCOUNTER — Ambulatory Visit (INDEPENDENT_AMBULATORY_CARE_PROVIDER_SITE_OTHER): Payer: 59 | Admitting: Adult Health

## 2016-10-24 VITALS — BP 146/84 | HR 101 | Ht 71.0 in | Wt 275.3 lb

## 2016-10-24 DIAGNOSIS — Z0000 Encounter for general adult medical examination without abnormal findings: Secondary | ICD-10-CM | POA: Diagnosis not present

## 2016-10-24 DIAGNOSIS — J302 Other seasonal allergic rhinitis: Secondary | ICD-10-CM | POA: Diagnosis not present

## 2016-10-24 DIAGNOSIS — I10 Essential (primary) hypertension: Secondary | ICD-10-CM | POA: Diagnosis not present

## 2016-10-24 MED ORDER — LISINOPRIL 10 MG PO TABS: 10.0000 mg | ORAL_TABLET | Freq: Every day | ORAL | 0 refills | Status: DC

## 2016-10-24 NOTE — Assessment & Plan Note (Addendum)
First BP check 161/110 Second after laying down on L side 146/84 BMP drawn and started on Lisinopril 10mg . Please check BP and HR and f/u in 1 month with recording log. Increase water, decrease salt. Continue to walk >2 miles and reduce caloric intake. Wt loss will help normalize BP

## 2016-10-24 NOTE — Patient Instructions (Signed)
Heart-Healthy Eating Plan Many factors influence your heart health, including eating and exercise habits. Heart (coronary) risk increases with abnormal blood fat (lipid) levels. Heart-healthy meal planning includes limiting unhealthy fats, increasing healthy fats, and making other small dietary changes. This includes maintaining a healthy body weight to help keep lipid levels within a normal range. What is my plan? Your health care provider recommends that you:  Get no more than _25__% of the total calories in your daily diet from fat.  Limit your intake of saturated fat to less than __5___% of your total calories each day.  Limit the amount of cholesterol in your diet to less than __300_ mg per day.  What types of fat should I choose?  Choose healthy fats more often. Choose monounsaturated and polyunsaturated fats, such as olive oil and canola oil, flaxseeds, walnuts, almonds, and seeds.  Eat more omega-3 fats. Good choices include salmon, mackerel, sardines, tuna, flaxseed oil, and ground flaxseeds. Aim to eat fish at least two times each week.  Limit saturated fats. Saturated fats are primarily found in animal products, such as meats, butter, and cream. Plant sources of saturated fats include palm oil, palm kernel oil, and coconut oil.  Avoid foods with partially hydrogenated oils in them. These contain trans fats. Examples of foods that contain trans fats are stick margarine, some tub margarines, cookies, crackers, and other baked goods. What general guidelines do I need to follow?  Check food labels carefully to identify foods with trans fats or high amounts of saturated fat.  Fill one half of your plate with vegetables and green salads. Eat 4-5 servings of vegetables per day. A serving of vegetables equals 1 cup of raw leafy vegetables,  cup of raw or cooked cut-up vegetables, or  cup of vegetable juice.  Fill one fourth of your plate with whole grains. Look for the word "whole" as  the first word in the ingredient list.  Fill one fourth of your plate with lean protein foods.  Eat 4-5 servings of fruit per day. A serving of fruit equals one medium whole fruit,  cup of dried fruit,  cup of fresh, frozen, or canned fruit, or  cup of 100% fruit juice.  Eat more foods that contain soluble fiber. Examples of foods that contain this type of fiber are apples, broccoli, carrots, beans, peas, and barley. Aim to get 20-30 g of fiber per day.  Eat more home-cooked food and less restaurant, buffet, and fast food.  Limit or avoid alcohol.  Limit foods that are high in starch and sugar.  Avoid fried foods.  Cook foods by using methods other than frying. Baking, boiling, grilling, and broiling are all great options. Other fat-reducing suggestions include: ? Removing the skin from poultry. ? Removing all visible fats from meats. ? Skimming the fat off of stews, soups, and gravies before serving them. ? Steaming vegetables in water or broth.  Lose weight if you are overweight. Losing just 5-10% of your initial body weight can help your overall health and prevent diseases such as diabetes and heart disease.  Increase your consumption of nuts, legumes, and seeds to 4-5 servings per week. One serving of dried beans or legumes equals  cup after being cooked, one serving of nuts equals 1 ounces, and one serving of seeds equals  ounce or 1 tablespoon.  You may need to monitor your salt (sodium) intake, especially if you have high blood pressure. Talk with your health care provider or dietitian to get  more information about reducing sodium. What foods can I eat? Grains  Breads, including [place], white, pita, wheat, raisin, rye, oatmeal, and [place]. Tortillas that are neither fried nor made with lard or trans fat. Low-fat rolls, including hotdog and hamburger buns and English muffins. Biscuits. Muffins. Waffles. Pancakes. Light popcorn. Whole-grain cereals. Flatbread. Melba toast.  Pretzels. Breadsticks. Rusks. Low-fat snacks and crackers, including oyster, saltine, matzo, graham, animal, and rye. Rice and pasta, including brown rice and those that are made with whole wheat. Vegetables All vegetables. Fruits All fruits, but limit coconut. Meats and Other Protein Sources Lean, well-trimmed beef, veal, pork, and lamb. Chicken and [place] without skin. All fish and shellfish. Wild duck, rabbit, pheasant, and venison. Egg whites or low-cholesterol egg substitutes. Dried beans, peas, lentils, and tofu.Seeds and most nuts. Dairy Low-fat or nonfat cheeses, including ricotta, string, and mozzarella. Skim or 1% milk that is liquid, powdered, or evaporated. Buttermilk that is made with low-fat milk. Nonfat or low-fat yogurt. Beverages Mineral water. Diet carbonated beverages. Sweets and Desserts Sherbets and fruit ices. Honey, jam, marmalade, jelly, and syrups. Meringues and gelatins. Pure sugar candy, such as hard candy, jelly beans, gumdrops, mints, marshmallows, and small amounts of dark chocolate. [employer] Eat all sweets and desserts in moderation. Fats and Oils Nonhydrogenated (trans-free) margarines. Vegetable oils, including soybean, sesame, sunflower, olive, peanut, safflower, corn, canola, and cottonseed. Salad dressings or mayonnaise that are made with a vegetable oil. Limit added fats and oils that you use for cooking, baking, salads, and as spreads. Other Cocoa powder. Coffee and tea. All seasonings and condiments. The items listed above may not be a complete list of recommended foods or beverages. Contact your dietitian for more options. What foods are not recommended? Grains Breads that are made with saturated or trans fats, oils, or whole milk. Croissants. Butter rolls. Cheese breads. Sweet rolls. Donuts. Buttered popcorn. Chow mein noodles. High-fat crackers, such as cheese or butter crackers. Meats and Other Protein Sources Fatty meats, such as hotdogs,  short ribs, sausage, spareribs, bacon, ribeye roast or steak, and mutton. High-fat deli meats, such as salami and bologna. Caviar. Domestic duck and goose. Organ meats, such as kidney, liver, sweetbreads, brains, gizzard, chitterlings, and heart. Dairy Cream, sour cream, cream cheese, and creamed cottage cheese. Whole milk cheeses, including blue (bleu), Monterey Jack, [place], [place], American, [place], Swiss, [place], [place], and [place]. Whole or 2% milk that is liquid, evaporated, or condensed. Whole buttermilk. Cream sauce or high-fat cheese sauce. Yogurt that is made from whole milk. Beverages Regular sodas and drinks with added sugar. Sweets and Desserts Frosting. Pudding. Cookies. Cakes other than angel food cake. Candy that has milk chocolate or white chocolate, hydrogenated fat, butter, coconut, or unknown ingredients. Buttered syrups. Full-fat ice cream or ice cream drinks. Fats and Oils Gravy that has suet, meat fat, or shortening. Cocoa butter, hydrogenated oils, palm oil, coconut oil, palm kernel oil. These can often be found in baked products, candy, fried foods, nondairy creamers, and whipped toppings. Solid fats and shortenings, including bacon fat, salt pork, lard, and butter. Nondairy cream substitutes, such as coffee creamers and sour cream substitutes. Salad dressings that are made of unknown oils, cheese, or sour cream. The items listed above may not be a complete list of foods and beverages to avoid. Contact your dietitian for more information. This information is not intended to replace advice given to you by your health care provider. Make sure you discuss any questions you have with your health care  provider. Document Released: 11/15/2007 Document Revised: 08/26/2015 Document Reviewed: 07/30/2013 Elsevier Interactive Patient Education  2017 [place].   Hypertension Hypertension, commonly called high blood pressure, is when the force of blood pumping through the arteries  is too strong. The arteries are the blood vessels that carry blood from the heart throughout the body. Hypertension forces the heart to work harder to pump blood and may cause arteries to become narrow or stiff. Having untreated or uncontrolled hypertension can cause heart attacks, strokes, kidney disease, and other problems. A blood pressure reading consists of a higher number over a lower number. Ideally, your blood pressure should be below 120/80. The first ("top") number is called the systolic pressure. It is a measure of the pressure in your arteries as your heart beats. The second ("bottom") number is called the diastolic pressure. It is a measure of the pressure in your arteries as the heart relaxes. What are the causes? The cause of this condition is not known. What increases the risk? Some risk factors for high blood pressure are under your control. Others are not. Factors you can change  Smoking.  Having type 2 diabetes mellitus, high cholesterol, or both.  Not getting enough exercise or physical activity.  Being overweight.  Having too much fat, sugar, calories, or salt (sodium) in your diet.  Drinking too much alcohol. Factors that are difficult or impossible to change  Having chronic kidney disease.  Having a family history of high blood pressure.  Age. Risk increases with age.  Race. You may be at higher risk if you are African-American.  Gender. Men are at higher risk than women before age 31. After age 30, women are at higher risk than men.  Having obstructive sleep apnea.  Stress. What are the signs or symptoms? Extremely high blood pressure (hypertensive crisis) may cause:  Headache.  Anxiety.  Shortness of breath.  Nosebleed.  Nausea and vomiting.  Severe chest pain.  Jerky movements you cannot control (seizures).  How is this diagnosed? This condition is diagnosed by measuring your blood pressure while you are seated, with your arm resting on a  surface. The cuff of the blood pressure monitor will be placed directly against the skin of your upper arm at the level of your heart. It should be measured at least twice using the same arm. Certain conditions can cause a difference in blood pressure between your right and left arms. Certain factors can cause blood pressure readings to be lower or higher than normal (elevated) for a short period of time:  When your blood pressure is higher when you are in a health care provider's office than when you are at home, this is called white coat hypertension. Most people with this condition do not need medicines.  When your blood pressure is higher at home than when you are in a health care provider's office, this is called masked hypertension. Most people with this condition may need medicines to control blood pressure.  If you have a high blood pressure reading during one visit or you have normal blood pressure with other risk factors:  You may be asked to return on a different day to have your blood pressure checked again.  You may be asked to monitor your blood pressure at home for 1 week or longer.  If you are diagnosed with hypertension, you may have other blood or imaging tests to help your health care provider understand your overall risk for other conditions. How is this treated? This  condition is treated by making healthy lifestyle changes, such as eating healthy foods, exercising more, and reducing your alcohol intake. Your health care provider may prescribe medicine if lifestyle changes are not enough to get your blood pressure under control, and if:  Your systolic blood pressure is above 130.  Your diastolic blood pressure is above 80.  Your personal target blood pressure may vary depending on your medical conditions, your age, and other factors. Follow these instructions at home: Eating and drinking  Eat a diet that is high in fiber and potassium, and low in sodium, added sugar, and  fat. An example eating plan is called the DASH (Dietary Approaches to Stop Hypertension) diet. To eat this way: ? Eat plenty of fresh fruits and vegetables. Try to fill half of your plate at each meal with fruits and vegetables. ? Eat whole grains, such as whole wheat pasta, brown rice, or whole grain bread. Fill about one quarter of your plate with whole grains. ? Eat or drink low-fat dairy products, such as skim milk or low-fat yogurt. ? Avoid fatty cuts of meat, processed or cured meats, and poultry with skin. Fill about one quarter of your plate with lean proteins, such as fish, chicken without skin, beans, eggs, and tofu. ? Avoid premade and processed foods. These tend to be higher in sodium, added sugar, and fat.  Reduce your daily sodium intake. Most people with hypertension should eat less than 1,500 mg of sodium a day.  Limit alcohol intake to no more than 1 drink a day for nonpregnant women and 2 drinks a day for men. One drink equals 12 oz of beer, 5 oz of wine, or 1 oz of hard liquor. Lifestyle  Work with your health care provider to maintain a healthy body weight or to lose weight. Ask what an ideal weight is for you.  Get at least 30 minutes of exercise that causes your heart to beat faster (aerobic exercise) most days of the week. Activities may include walking, swimming, or biking.  Include exercise to strengthen your muscles (resistance exercise), such as pilates or lifting weights, as part of your weekly exercise routine. Try to do these types of exercises for 30 minutes at least 3 days a week.  Do not use any products that contain nicotine or tobacco, such as cigarettes and e-cigarettes. If you need help quitting, ask your health care provider.  Monitor your blood pressure at home as told by your health care provider.  Keep all follow-up visits as told by your health care provider. This is important. Medicines  Take over-the-counter and prescription medicines only as told  by your health care provider. Follow directions carefully. Blood pressure medicines must be taken as prescribed.  Do not skip doses of blood pressure medicine. Doing this puts you at risk for problems and can make the medicine less effective.  Ask your health care provider about side effects or reactions to medicines that you should watch for. Contact a health care provider if:  You think you are having a reaction to a medicine you are taking.  You have headaches that keep coming back (recurring).  You feel dizzy.  You have swelling in your ankles.  You have trouble with your vision. Get help right away if:  You develop a severe headache or confusion.  You have unusual weakness or numbness.  You feel faint.  You have severe pain in your chest or abdomen.  You vomit repeatedly.  You have trouble breathing.  Summary  Hypertension is when the force of blood pumping through your arteries is too strong. If this condition is not controlled, it may put you at risk for serious complications.  Your personal target blood pressure may vary depending on your medical conditions, your age, and other factors. For most people, a normal blood pressure is less than 120/80.  Hypertension is treated with lifestyle changes, medicines, or a combination of both. Lifestyle changes include weight loss, eating a healthy, low-sodium diet, exercising more, and limiting alcohol. This information is not intended to replace advice given to you by your health care provider. Make sure you discuss any questions you have with your health care provider. Document Released: 02/05/2005 Document Revised: 01/04/2016 Document Reviewed: 01/04/2016 Elsevier Interactive Patient Education  [employer].  We will call lab results are available. Please start Lisinopril 10mg  daily and check daily blood pressure and heart rate. Increase water intake, strive for at least >gallon/day. Follow heart healthy diet and  continue regular exercise.  Please schedule follow-up in 4 weeks to assess effectiveness of Lisinopril and full physical in 8 weeks. WELCOME TO THE PRACTICE!

## 2016-10-24 NOTE — Progress Notes (Signed)
Subjective:    Patient ID: [name] [name], male    DOB: 02-17-1971, 46 y.o.   MRN: [id number]  HPI:  [name] [name] presents to establish as a new pt.  He is a very pleasant 46 year old male. PMH: HTN, obesity, and productive cough that developed last week after pitching during softball practice in 100 degree heat. He has also had clear nasal drainage and "post nasal gtt".  He has used a few doses of Therflu and feels that it help resolve his acute sx's. He denies tobacco/EOTH use.  He denies also states "I think I might developing some anxiety as a get older".  He recently started reducing soda and increasing water intake and estimates to drink 40 ounces water/day.  He has been reducing saturated fat and CHO intake as well.  He has lost 20 lbs I last 12 months and hopes to loss another 75 lbs. He denies CP/dyspnea/palpipations.   Patient Care Team    Relationship Specialty Notifications Start End  Esaw Grandchild, NP PCP - General Family Medicine  10/24/16     Patient Active Problem List   Diagnosis Date Noted  . Hypertension 10/24/2016  . Healthcare maintenance 10/24/2016  . Seasonal allergies 10/24/2016  . Lumbar back pain with radiculopathy affecting left lower extremity 06/05/2016  . Left knee pain 06/05/2016  . Hypokalemia 02/10/2014  . Pancreatitis 02/05/2014  . Acute pancreatitis 02/05/2014  . Cholelithiasis 02/05/2014  . Hepatic steatosis 02/05/2014  . Elevated LFTs 02/05/2014     Past Medical History:  Diagnosis Date  . Anxiety   . Arthritis    hip pain from time to time   . Hernia of abdominal cavity "born w/it"  . Kidney stones      Past Surgical History:  Procedure Laterality Date  . CHOLECYSTECTOMY N/A 02/08/2014   Procedure: ATTEMPTED LAPAROSCOPIC CHOLECYSTECTOMY;  Surgeon: [name], MD;  Location: [facility];  Service: General;  Laterality: N/A;  . CHOLECYSTECTOMY N/A 02/08/2014   rocedure: CHOLECYSTECTOMY;  Surgeon: [name], MD;  Location: [facility];   Service: General;  Laterality: N/A;  . HERNIA REPAIR    . INSERTION OF MESH N/A 09/29/2015   Procedure: INSERTION OF MESH;  Surgeon: [name], MD;  Location: [facility];  Service: General;  Laterality: N/A;  . URETEROLITHOTOMY  1989  . VENTRAL HERNIA REPAIR N/A 09/29/2015   Procedure: LAPAROSCOPIC VENTRAL HERNIA;  Surgeon: [name], MD;  Location: [facility];  Service: General;  Laterality: N/A;     Family History  Problem Relation Age of Onset  . Asthma Mother   . COPD Mother   . Cancer Father        bone  . Glaucoma Father   . Healthy Brother      History  Drug Use No     History  Alcohol Use No     History  Smoking Status  . Never Smoker  Smokeless Tobacco  . Never Used     Outpatient Encounter Prescriptions as of 10/24/2016  Medication Sig  . acetaminophen (TYLENOL) 325 MG tablet Take 650 mg by mouth every 6 (six) hours as needed for mild pain.  . [DISCONTINUED] meloxicam (MOBIC) 15 MG tablet Take 1 tablet (15 mg total) by mouth daily.  Marland Kitchen lisinopril (PRINIVIL,ZESTRIL) 10 MG tablet Take 1 tablet (10 mg total) by mouth daily.   No facility-administered encounter medications on file as of 10/24/2016.     Allergies: Prednisone  Body mass index is 38.4 kg/m.  Blood pressure (!) 146/84, pulse (!) 101, height 5\' 11"  (1.803 m), weight 275 lb 4.8 oz (124.9 kg).     Review of Systems  Constitutional: Positive for fatigue. Negative for activity change, appetite change, chills, diaphoresis, fever and unexpected weight change.  HENT: Positive for congestion, postnasal drip and sneezing. Negative for trouble swallowing and voice change.   Eyes: Negative for visual disturbance.  Respiratory: Positive for cough and wheezing. Negative for chest tightness, shortness of breath and stridor.   Cardiovascular: Negative for chest pain, palpitations and leg swelling.  Gastrointestinal: Negative for abdominal distention, abdominal pain, blood in stool, constipation, diarrhea,  nausea and vomiting.  Endocrine: Negative for cold intolerance, heat intolerance, polydipsia, polyphagia and polyuria.  Genitourinary: Negative for difficulty urinating and flank pain.  Neurological: Negative for tremors, weakness and headaches.  Hematological: Does not bruise/bleed easily.  Psychiatric/Behavioral: Negative for decreased concentration, self-injury, sleep disturbance and suicidal ideas. The patient is nervous/anxious.        Objective:   Physical Exam  Constitutional: He is oriented to person, place, and time. He appears well-developed and well-nourished. No distress.  HENT:  Head: Normocephalic and atraumatic.  Right Ear: Hearing and external ear normal. Tympanic membrane is bulging. Tympanic membrane is not erythematous. No decreased hearing is noted.  Left Ear: Hearing, external ear and ear canal normal. Tympanic membrane is bulging. Tympanic membrane is not erythematous. No decreased hearing is noted.  Nose: Mucosal edema and rhinorrhea present. Right sinus exhibits no maxillary sinus tenderness and no frontal sinus tenderness. Left sinus exhibits no frontal sinus tenderness.  Mouth/Throat: Uvula is midline. Posterior oropharyngeal edema and posterior oropharyngeal erythema present. No oropharyngeal exudate.  Copious clear drainage down the back of his throat.  Eyes: Pupils are equal, round, and reactive to light. Conjunctivae are normal.  Neck: Normal range of motion. Neck supple.  Cardiovascular: Normal rate, regular rhythm, normal heart sounds and intact distal pulses.   No murmur heard. Pulmonary/Chest: Effort normal and breath sounds normal. No respiratory distress. He has no wheezes. He has no rales. He exhibits no tenderness.  Lymphadenopathy:    He has no cervical adenopathy.  Neurological: He is alert and oriented to person, place, and time. Coordination normal.  Skin: Skin is warm and dry. No rash noted. He is not diaphoretic. No erythema. No pallor.   Psychiatric: He has a normal mood and affect. His behavior is normal. Judgment and thought content normal.  Nursing note and vitals reviewed.         Assessment & Plan:   1. Hypertension, unspecified type   2. Healthcare maintenance   3. Seasonal allergic rhinitis, unspecified trigger     Hypertension First BP check 161/110 Second after laying down on L side 146/84 BMP drawn and started on Lisinopril 10mg . Please check BP and HR and f/u in 1 month with recording log. Increase water, decrease salt. Continue to walk >2 miles and reduce caloric intake. Wt loss will help normalize BP  Healthcare maintenance Increase water intake, strive for >gallon/day. Heart healthy diet and continue to reduce caloric intake. Continue daily walking. Continue to avoid tobacco/EOTH CPE with fasting labs in 8 weeks.  Seasonal allergies Increase water and vit c intake. OTC Claritan daily, take for at least 10 days.     FOLLOW-UP:  Return in about 4 weeks (around 11/21/2016) for HTN, Evaluate Medication Effectiveness.

## 2016-10-24 NOTE — Assessment & Plan Note (Addendum)
Increase water intake, strive for >gallon/day. Heart healthy diet and continue to reduce caloric intake. Continue daily walking. Continue to avoid tobacco/EOTH CPE with fasting labs in 8 weeks.

## 2016-10-24 NOTE — Assessment & Plan Note (Signed)
Increase water and vit c intake. OTC Claritan daily, take for at least 10 days.

## 2016-10-25 ENCOUNTER — Ambulatory Visit: Payer: 59 | Admitting: Adult Health

## 2016-10-25 LAB — BASIC METABOLIC PANEL
BUN / CREAT RATIO: 15 (ref 9–20)
BUN: 16 mg/dL (ref 6–24)
CHLORIDE: 105 mmol/L (ref 96–106)
CO2: 23 mmol/L (ref 20–29)
CREATININE: 1.04 mg/dL (ref 0.76–1.27)
Calcium: 9.5 mg/dL (ref 8.7–10.2)
GFR calc Af Amer: 98 mL/min/{1.73_m2} (ref >59)
GFR calc non Af Amer: 85 mL/min/{1.73_m2} (ref >59)
GLUCOSE: 93 mg/dL (ref 65–99)
Potassium: 4 mmol/L (ref 3.5–5.2)
SODIUM: 143 mmol/L (ref 134–144)

## 2016-11-20 ENCOUNTER — Telehealth: Payer: Self-pay | Admitting: Adult Health

## 2016-11-20 NOTE — Telephone Encounter (Signed)
Advised pt's spouse that RX was sent on 10/24/16 for a 90 day supply and therefore, he should not need refills at this time.  Pt's spouse states that pharmacy only gave them a 30 day supply.  Spouse will check with pharmacy regarding additional tablets and will call us back if there are any problems.  Charyl Bigger, CMA

## 2016-11-20 NOTE — Telephone Encounter (Signed)
Patient's wife called on behalf the patient. He has a f/u appt with Valetta Fuller on Monday but will be out of his lisinopril on Saturday. She wants to know does he needa refill to continue this until his appt. If so, send refill to [facility]

## 2016-11-26 ENCOUNTER — Encounter: Payer: Self-pay | Admitting: Adult Health

## 2016-11-26 ENCOUNTER — Ambulatory Visit (INDEPENDENT_AMBULATORY_CARE_PROVIDER_SITE_OTHER): Payer: 59 | Admitting: Adult Health

## 2016-11-26 VITALS — BP 111/70 | HR 69 | Ht 71.0 in | Wt 272.8 lb

## 2016-11-26 DIAGNOSIS — K861 Other chronic pancreatitis: Secondary | ICD-10-CM

## 2016-11-26 DIAGNOSIS — Z0000 Encounter for general adult medical examination without abnormal findings: Secondary | ICD-10-CM | POA: Diagnosis not present

## 2016-11-26 DIAGNOSIS — I10 Essential (primary) hypertension: Secondary | ICD-10-CM

## 2016-11-26 NOTE — Assessment & Plan Note (Addendum)
BP 111/70, 69 Continue Lisinopril 10mg  daily. Increase intensity of exercise to increase fat burn/weight loss. CPE with fasting labs next month.

## 2016-11-26 NOTE — Patient Instructions (Addendum)
Hypertension Hypertension, commonly called high blood pressure, is when the force of blood pumping through the arteries is too strong. The arteries are the blood vessels that carry blood from the heart throughout the body. Hypertension forces the heart to work harder to pump blood and may cause arteries to become narrow or stiff. Having untreated or uncontrolled hypertension can cause heart attacks, strokes, kidney disease, and other problems. A blood pressure reading consists of a higher number over a lower number. Ideally, your blood pressure should be below 120/80. The first ("top") number is called the systolic pressure. It is a measure of the pressure in your arteries as your heart beats. The second ("bottom") number is called the diastolic pressure. It is a measure of the pressure in your arteries as the heart relaxes. What are the causes? The cause of this condition is not known. What increases the risk? Some risk factors for high blood pressure are under your control. Others are not. Factors you can change  Smoking.  Having type 2 diabetes mellitus, high cholesterol, or both.  Not getting enough exercise or physical activity.  Being overweight.  Having too much fat, sugar, calories, or salt (sodium) in your diet.  Drinking too much alcohol. Factors that are difficult or impossible to change  Having chronic kidney disease.  Having a family history of high blood pressure.  Age. Risk increases with age.  Race. You may be at higher risk if you are African-American.  Gender. Men are at higher risk than women before age 45. After age 65, women are at higher risk than men.  Having obstructive sleep apnea.  Stress. What are the signs or symptoms? Extremely high blood pressure (hypertensive crisis) may cause:  Headache.  Anxiety.  Shortness of breath.  Nosebleed.  Nausea and vomiting.  Severe chest pain.  Jerky movements you cannot control (seizures).  How is this  diagnosed? This condition is diagnosed by measuring your blood pressure while you are seated, with your arm resting on a surface. The cuff of the blood pressure monitor will be placed directly against the skin of your upper arm at the level of your heart. It should be measured at least twice using the same arm. Certain conditions can cause a difference in blood pressure between your right and left arms. Certain factors can cause blood pressure readings to be lower or higher than normal (elevated) for a short period of time:  When your blood pressure is higher when you are in a health care provider's office than when you are at home, this is called white coat hypertension. Most people with this condition do not need medicines.  When your blood pressure is higher at home than when you are in a health care provider's office, this is called masked hypertension. Most people with this condition may need medicines to control blood pressure.  If you have a high blood pressure reading during one visit or you have normal blood pressure with other risk factors:  You may be asked to return on a different day to have your blood pressure checked again.  You may be asked to monitor your blood pressure at home for 1 week or longer.  If you are diagnosed with hypertension, you may have other blood or imaging tests to help your health care provider understand your overall risk for other conditions. How is this treated? This condition is treated by making healthy lifestyle changes, such as eating healthy foods, exercising more, and reducing your alcohol intake. Your   health care provider may prescribe medicine if lifestyle changes are not enough to get your blood pressure under control, and if:  Your systolic blood pressure is above 130.  Your diastolic blood pressure is above 80.  Your personal target blood pressure may vary depending on your medical conditions, your age, and other factors. Follow these  instructions at home: Eating and drinking  Eat a diet that is high in fiber and potassium, and low in sodium, added sugar, and fat. An example eating plan is called the DASH (Dietary Approaches to Stop Hypertension) diet. To eat this way: ? Eat plenty of fresh fruits and vegetables. Try to fill half of your plate at each meal with fruits and vegetables. ? Eat whole grains, such as whole wheat pasta, brown rice, or whole grain bread. Fill about one quarter of your plate with whole grains. ? Eat or drink low-fat dairy products, such as skim milk or low-fat yogurt. ? Avoid fatty cuts of meat, processed or cured meats, and poultry with skin. Fill about one quarter of your plate with lean proteins, such as fish, chicken without skin, beans, eggs, and tofu. ? Avoid premade and processed foods. These tend to be higher in sodium, added sugar, and fat.  Reduce your daily sodium intake. Most people with hypertension should eat less than 1,500 mg of sodium a day.  Limit alcohol intake to no more than 1 drink a day for nonpregnant women and 2 drinks a day for men. One drink equals 12 oz of beer, 5 oz of wine, or 1 oz of hard liquor. Lifestyle  Work with your health care provider to maintain a healthy body weight or to lose weight. Ask what an ideal weight is for you.  Get at least 30 minutes of exercise that causes your heart to beat faster (aerobic exercise) most days of the week. Activities may include walking, swimming, or biking.  Include exercise to strengthen your muscles (resistance exercise), such as pilates or lifting weights, as part of your weekly exercise routine. Try to do these types of exercises for 30 minutes at least 3 days a week.  Do not use any products that contain nicotine or tobacco, such as cigarettes and e-cigarettes. If you need help quitting, ask your health care provider.  Monitor your blood pressure at home as told by your health care provider.  Keep all follow-up visits as  told by your health care provider. This is important. Medicines  Take over-the-counter and prescription medicines only as told by your health care provider. Follow directions carefully. Blood pressure medicines must be taken as prescribed.  Do not skip doses of blood pressure medicine. Doing this puts you at risk for problems and can make the medicine less effective.  Ask your health care provider about side effects or reactions to medicines that you should watch for. Contact a health care provider if:  You think you are having a reaction to a medicine you are taking.  You have headaches that keep coming back (recurring).  You feel dizzy.  You have swelling in your ankles.  You have trouble with your vision. Get help right away if:  You develop a severe headache or confusion.  You have unusual weakness or numbness.  You feel faint.  You have severe pain in your chest or abdomen.  You vomit repeatedly.  You have trouble breathing. Summary  Hypertension is when the force of blood pumping through your arteries is too strong. If this condition is not   controlled, it may put you at risk for serious complications.  Your personal target blood pressure may vary depending on your medical conditions, your age, and other factors. For most people, a normal blood pressure is less than 120/80.  Hypertension is treated with lifestyle changes, medicines, or a combination of both. Lifestyle changes include weight loss, eating a healthy, low-sodium diet, exercising more, and limiting alcohol. This information is not intended to replace advice given to you by your health care provider. Make sure you discuss any questions you have with your health care provider. Document Released: 02/05/2005 Document Revised: 01/04/2016 Document Reviewed: 01/04/2016 Elsevier Interactive Patient Education  2018 [employer].   Heart-Healthy Eating Plan Many factors influence your heart health, including  eating and exercise habits. Heart (coronary) risk increases with abnormal blood fat (lipid) levels. Heart-healthy meal planning includes limiting unhealthy fats, increasing healthy fats, and making other small dietary changes. This includes maintaining a healthy body weight to help keep lipid levels within a normal range. What is my plan? Your health care provider recommends that you:  Get no more than __25__% of the total calories in your daily diet from fat.  Limit your intake of saturated fat to less than ___3__% of your total calories each day.  Limit the amount of cholesterol in your diet to less than __300___ mg per day.  What types of fat should I choose?  Choose healthy fats more often. Choose monounsaturated and polyunsaturated fats, such as olive oil and canola oil, flaxseeds, walnuts, almonds, and seeds.  Eat more omega-3 fats. Good choices include salmon, mackerel, sardines, tuna, flaxseed oil, and ground flaxseeds. Aim to eat fish at least two times each week.  Limit saturated fats. Saturated fats are primarily found in animal products, such as meats, butter, and cream. Plant sources of saturated fats include palm oil, palm kernel oil, and coconut oil.  Avoid foods with partially hydrogenated oils in them. These contain trans fats. Examples of foods that contain trans fats are stick margarine, some tub margarines, cookies, crackers, and other baked goods. What general guidelines do I need to follow?  Check food labels carefully to identify foods with trans fats or high amounts of saturated fat.  Fill one half of your plate with vegetables and green salads. Eat 4-5 servings of vegetables per day. A serving of vegetables equals 1 cup of raw leafy vegetables,  cup of raw or cooked cut-up vegetables, or  cup of vegetable juice.  Fill one fourth of your plate with whole grains. Look for the word "whole" as the first word in the ingredient list.  Fill one fourth of your plate with  lean protein foods.  Eat 4-5 servings of fruit per day. A serving of fruit equals one medium whole fruit,  cup of dried fruit,  cup of fresh, frozen, or canned fruit, or  cup of 100% fruit juice.  Eat more foods that contain soluble fiber. Examples of foods that contain this type of fiber are apples, broccoli, carrots, beans, peas, and barley. Aim to get 20-30 g of fiber per day.  Eat more home-cooked food and less restaurant, buffet, and fast food.  Limit or avoid alcohol.  Limit foods that are high in starch and sugar.  Avoid fried foods.  Cook foods by using methods other than frying. Baking, boiling, grilling, and broiling are all great options. Other fat-reducing suggestions include: ? Removing the skin from poultry. ? Removing all visible fats from meats. ? Skimming the fat off  of stews, soups, and gravies before serving them. ? Steaming vegetables in water or broth.  Lose weight if you are overweight. Losing just 5-10% of your initial body weight can help your overall health and prevent diseases such as diabetes and heart disease.  Increase your consumption of nuts, legumes, and seeds to 4-5 servings per week. One serving of dried beans or legumes equals  cup after being cooked, one serving of nuts equals 1 ounces, and one serving of seeds equals  ounce or 1 tablespoon.  You may need to monitor your salt (sodium) intake, especially if you have high blood pressure. Talk with your health care provider or dietitian to get more information about reducing sodium. What foods can I eat? Grains  Breads, including [place], white, pita, wheat, raisin, rye, oatmeal, and [place]. Tortillas that are neither fried nor made with lard or trans fat. Low-fat rolls, including hotdog and hamburger buns and English muffins. Biscuits. Muffins. Waffles. Pancakes. Light popcorn. Whole-grain cereals. Flatbread. Melba toast. Pretzels. Breadsticks. Rusks. Low-fat snacks and crackers, including oyster,  saltine, matzo, graham, animal, and rye. Rice and pasta, including brown rice and those that are made with whole wheat. Vegetables All vegetables. Fruits All fruits, but limit coconut. Meats and Other Protein Sources Lean, well-trimmed beef, veal, pork, and lamb. Chicken and [place] without skin. All fish and shellfish. Wild duck, rabbit, pheasant, and venison. Egg whites or low-cholesterol egg substitutes. Dried beans, peas, lentils, and tofu.Seeds and most nuts. Dairy Low-fat or nonfat cheeses, including ricotta, string, and mozzarella. Skim or 1% milk that is liquid, powdered, or evaporated. Buttermilk that is made with low-fat milk. Nonfat or low-fat yogurt. Beverages Mineral water. Diet carbonated beverages. Sweets and Desserts Sherbets and fruit ices. Honey, jam, marmalade, jelly, and syrups. Meringues and gelatins. Pure sugar candy, such as hard candy, jelly beans, gumdrops, mints, marshmallows, and small amounts of dark chocolate. [employer] Eat all sweets and desserts in moderation. Fats and Oils Nonhydrogenated (trans-free) margarines. Vegetable oils, including soybean, sesame, sunflower, olive, peanut, safflower, corn, canola, and cottonseed. Salad dressings or mayonnaise that are made with a vegetable oil. Limit added fats and oils that you use for cooking, baking, salads, and as spreads. Other Cocoa powder. Coffee and tea. All seasonings and condiments. The items listed above may not be a complete list of recommended foods or beverages. Contact your dietitian for more options. What foods are not recommended? Grains Breads that are made with saturated or trans fats, oils, or whole milk. Croissants. Butter rolls. Cheese breads. Sweet rolls. Donuts. Buttered popcorn. Chow mein noodles. High-fat crackers, such as cheese or butter crackers. Meats and Other Protein Sources Fatty meats, such as hotdogs, short ribs, sausage, spareribs, bacon, ribeye roast or steak, and mutton.  High-fat deli meats, such as salami and bologna. Caviar. Domestic duck and goose. Organ meats, such as kidney, liver, sweetbreads, brains, gizzard, chitterlings, and heart. Dairy Cream, sour cream, cream cheese, and creamed cottage cheese. Whole milk cheeses, including blue (bleu), Monte[place] Jack, [place], [place], [place], [place], Swiss, [place], [place], and [place]. Whole or 2% milk that is liquid, evaporated, or condensed. Whole buttermilk. Cream sauce or high-fat cheese sauce. Yogurt that is made from whole milk. Beverages Regular sodas and drinks with added sugar. Sweets and Desserts Frosting. Pudding. Cookies. Cakes other than angel food cake. Candy that has milk chocolate or white chocolate, hydrogenated fat, butter, coconut, or unknown ingredients. Buttered syrups. Full-fat ice cream or ice cream drinks. Fats and Oils Gravy that has suet,  meat fat, or shortening. Cocoa butter, hydrogenated oils, palm oil, coconut oil, palm kernel oil. These can often be found in baked products, candy, fried foods, nondairy creamers, and whipped toppings. Solid fats and shortenings, including bacon fat, salt pork, lard, and butter. Nondairy cream substitutes, such as coffee creamers and sour cream substitutes. Salad dressings that are made of unknown oils, cheese, or sour cream. The items listed above may not be a complete list of foods and beverages to avoid. Contact your dietitian for more information. This information is not intended to replace advice given to you by your health care provider. Make sure you discuss any questions you have with your health care provider. Document Released: 11/15/2007 Document Revised: 08/26/2015 Document Reviewed: 07/30/2013 Elsevier Interactive Patient Education  2017 [employer].   Exercising to [employer] Exercising can help you to lose weight. In order to lose weight through exercise, you need to do vigorous-intensity exercise. You can tell that you are exercising with  vigorous intensity if you are breathing very hard and fast and cannot hold a conversation while exercising. Moderate-intensity exercise helps to maintain your current weight. You can tell that you are exercising at a moderate level if you have a higher heart rate and faster breathing, but you are still able to hold a conversation. How often should I exercise? Choose an activity that you enjoy and set realistic goals. Your health care provider can help you to make an activity plan that works for you. Exercise regularly as directed by your health care provider. This may include:  Doing resistance training twice each week, such as: ? Push-ups. ? Sit-ups. ? Lifting weights. ? Using resistance bands.  Doing a given intensity of exercise for a given amount of time. Choose from these options: ? 150 minutes of moderate-intensity exercise every week. ? 75 minutes of vigorous-intensity exercise every week. ? A mix of moderate-intensity and vigorous-intensity exercise every week.  Children, pregnant women, people who are out of shape, people who are overweight, and older adults may need to consult a health care provider for individual recommendations. If you have any sort of medical condition, be sure to consult your health care provider before starting a new exercise program. What are some activities that can help me to lose weight?  Walking at a rate of at least 4.5 miles an hour.  Jogging or running at a rate of 5 miles per hour.  Biking at a rate of at least 10 miles per hour.  Lap swimming.  Roller-skating or in-line skating.  Cross-country skiing.  Vigorous competitive sports, such as football, basketball, and soccer.  Jumping rope.  Aerobic dancing. How can I be more active in my day-to-day activities?  Use the stairs instead of the elevator.  Take a walk during your lunch break.  If you drive, park your car farther away from work or school.  If you take public transportation,  get off one stop early and walk the rest of the way.  Make all of your phone calls while standing up and walking around.  Get up, stretch, and walk around every 30 minutes throughout the day. What guidelines should I follow while exercising?  Do not exercise so much that you hurt yourself, feel dizzy, or get very short of breath.  Consult your health care provider prior to starting a new exercise program.  Wear comfortable clothes and shoes with good support.  Drink plenty of water while you exercise to prevent dehydration or heat stroke. Body  water is lost during exercise and must be replaced.  Work out until you breathe faster and your heart beats faster. This information is not intended to replace advice given to you by your health care provider. Make sure you discuss any questions you have with your health care provider. Document Released: 03/10/2010 Document Revised: 07/14/2015 Document Reviewed: 07/09/2013 Elsevier Interactive Patient Education  2018 [employer].  Increase water intake, strive for at leas 130 ounces/day.   Follow Heart Healthy diet Increase regular exercise.  Recommend at least 30 minutes daily, 5 days per week of walking, jogging, biking, swimming, YouTube/Pinterest workout videos. Increase intensity of workouts to elevated heart rate and increase fat burn/weight loss. We will check full set of labs at your physical next month. KEEP UP THE GREAT WORK!

## 2016-11-26 NOTE — Progress Notes (Signed)
Subjective:    Patient ID: [name] [name], male    DOB: [birth year], 46 y.o.   MRN: [id number]  HPI: 10/24/2016  OV: [name] [name] presents to establish as a new pt.  He is a very pleasant 46 year old male. PMH: HTN, obesity, and productive cough that developed last week after pitching during softball practice in 100 degree heat. He has also had clear nasal drainage and "post nasal gtt".  He has used a few doses of Therflu and feels that it help resolve his acute sx's. He denies tobacco/EOTH use.  He denies also states "I think I might developing some anxiety as a get older".  He recently started reducing soda and increasing water intake and estimates to drink 40 ounces water/day.  He has been reducing saturated fat and CHO intake as well.  He has lost 20 lbs I last 12 months and hopes to loss another 75 lbs. He denies CP/dyspnea/palpipations.  Today's OV Note: [name] [name] is here for /f/u HTN.  At initial OV BP was well above goal, BMP drawn and started on Lisinopril 10mg .  He reports medication compliance and denies SE.  He continues to walk 72miles day and has been following a heart healthy diet (minus yesterday at family reunion buffet).  He does not check his BP at home   Patient Care Team    Relationship Specialty Notifications Start End  Esaw Grandchild, NP PCP - General Family Medicine  10/24/16     Patient Active Problem List   Diagnosis Date Noted  . Hypertension 10/24/2016  . Healthcare maintenance 10/24/2016  . Seasonal allergies 10/24/2016  . Lumbar back pain with radiculopathy affecting left lower extremity 06/05/2016  . Left knee pain 06/05/2016  . Hypokalemia 02/10/2014  . Pancreatitis 02/05/2014  . Acute pancreatitis 02/05/2014  . Cholelithiasis 02/05/2014  . Hepatic steatosis 02/05/2014  . Elevated LFTs 02/05/2014     Past Medical History:  Diagnosis Date  . Anxiety   . Arthritis    hip pain from time to time   . Hernia of abdominal cavity "born w/it"  . Kidney  stones      Past Surgical History:  Procedure Laterality Date  . CHOLECYSTECTOMY N/A 02/08/2014   Procedure: ATTEMPTED LAPAROSCOPIC CHOLECYSTECTOMY;  Surgeon: [name], MD;  Location: [facility];  Service: General;  Laterality: N/A;  . CHOLECYSTECTOMY N/A 02/08/2014   rocedure: CHOLECYSTECTOMY;  Surgeon: [name], MD;  Location: [facility];  Service: General;  Laterality: N/A;  . HERNIA REPAIR    . INSERTION OF MESH N/A 09/29/2015   Procedure: INSERTION OF MESH;  Surgeon: [name], MD;  Location: [facility];  Service: General;  Laterality: N/A;  . URETEROLITHOTOMY  1989  . VENTRAL HERNIA REPAIR N/A 09/29/2015   Procedure: LAPAROSCOPIC VENTRAL HERNIA;  Surgeon: [name], MD;  Location: [facility];  Service: General;  Laterality: N/A;     Family History  Problem Relation Age of Onset  . Asthma Mother   . COPD Mother   . Cancer Father        bone  . Glaucoma Father   . Healthy Brother      History  Drug Use No     History  Alcohol Use No     History  Smoking Status  . Never Smoker  Smokeless Tobacco  . Never Used     Outpatient Encounter Prescriptions as of 11/26/2016  Medication Sig  . acetaminophen (TYLENOL) 325 MG tablet Take 650 mg by mouth  every 6 (six) hours as needed for mild pain.  Marland Kitchen lisinopril (PRINIVIL,ZESTRIL) 10 MG tablet Take 1 tablet (10 mg total) by mouth daily.   No facility-administered encounter medications on file as of 11/26/2016.     Allergies: Prednisone  Body mass index is 38.05 kg/m.  Blood pressure 111/70, pulse 69, height 5\' 11"  (1.803 m), weight 272 lb 12.8 oz (123.7 kg).     Review of Systems  Constitutional: Positive for fatigue. Negative for activity change, appetite change, chills, diaphoresis, fever and unexpected weight change.  HENT: Negative for congestion, postnasal drip, sneezing, trouble swallowing and voice change.   Eyes: Negative for visual disturbance.  Respiratory: Negative for cough, chest tightness,  shortness of breath, wheezing and stridor.   Cardiovascular: Negative for chest pain, palpitations and leg swelling.  Gastrointestinal: Negative for abdominal distention, abdominal pain, blood in stool, constipation, diarrhea, nausea and vomiting.  Endocrine: Negative for cold intolerance, heat intolerance, polydipsia, polyphagia and polyuria.  Genitourinary: Negative for difficulty urinating and flank pain.  Neurological: Negative for tremors, weakness and headaches.  Hematological: Does not bruise/bleed easily.  Psychiatric/Behavioral: Negative for decreased concentration, self-injury, sleep disturbance and suicidal ideas. The patient is nervous/anxious.        Objective:   Physical Exam  Constitutional: He is oriented to person, place, and time. He appears well-developed and well-nourished. No distress.  HENT:  Head: Normocephalic and atraumatic.  Right Ear: Hearing and external ear normal. Tympanic membrane is not erythematous and not bulging. No decreased hearing is noted.  Left Ear: Hearing, external ear and ear canal normal. Tympanic membrane is not erythematous and not bulging. No decreased hearing is noted.  Nose: No mucosal edema or rhinorrhea. Right sinus exhibits no maxillary sinus tenderness and no frontal sinus tenderness. Left sinus exhibits no frontal sinus tenderness.  Mouth/Throat: Uvula is midline. No oropharyngeal exudate, posterior oropharyngeal edema or posterior oropharyngeal erythema.  Eyes: Pupils are equal, round, and reactive to light. Conjunctivae are normal.  Neck: Normal range of motion. Neck supple.  Cardiovascular: Normal rate, regular rhythm, normal heart sounds and intact distal pulses.   No murmur heard. Pulmonary/Chest: Effort normal and breath sounds normal. No respiratory distress. He has no wheezes. He has no rales. He exhibits no tenderness.  Lymphadenopathy:    He has no cervical adenopathy.  Neurological: He is alert and oriented to person, place,  and time. Coordination normal.  Skin: Skin is warm and dry. No rash noted. He is not diaphoretic. No erythema. No pallor.  Psychiatric: He has a normal mood and affect. His behavior is normal. Judgment and thought content normal.  Nursing note and vitals reviewed.         Assessment & Plan:   1. Hypertension, unspecified type   2. Other chronic pancreatitis ([place])   3. Healthcare maintenance     Hypertension BP 111/70, 69 Continue Lisinopril 10mg  daily. Increase intensity of exercise to increase fat burn/weight loss. CPE with fasting labs next month.    FOLLOW-UP:  Return in about 4 weeks (around 12/24/2016) for CPE.

## 2016-12-24 ENCOUNTER — Other Ambulatory Visit (INDEPENDENT_AMBULATORY_CARE_PROVIDER_SITE_OTHER): Payer: 59

## 2016-12-24 DIAGNOSIS — I10 Essential (primary) hypertension: Secondary | ICD-10-CM

## 2016-12-24 DIAGNOSIS — Z0000 Encounter for general adult medical examination without abnormal findings: Secondary | ICD-10-CM

## 2016-12-24 DIAGNOSIS — K861 Other chronic pancreatitis: Secondary | ICD-10-CM

## 2016-12-25 LAB — CBC WITH DIFFERENTIAL/PLATELET
Basophils Absolute: 0 10*3/uL (ref 0.0–0.2)
Basos: 0 %
EOS (ABSOLUTE): 0.3 10*3/uL (ref 0.0–0.4)
EOS: 4 %
HEMATOCRIT: 43.6 % (ref 37.5–51.0)
HEMOGLOBIN: 15.4 g/dL (ref 13.0–17.7)
IMMATURE GRANS (ABS): 0 10*3/uL (ref 0.0–0.1)
Immature Granulocytes: 0 %
LYMPHS ABS: 3 10*3/uL (ref 0.7–3.1)
Lymphs: 34 %
MCH: 30.4 pg (ref 26.6–33.0)
MCHC: 35.3 g/dL (ref 31.5–35.7)
MCV: 86 fL (ref 79–97)
MONOCYTES: 8 %
Monocytes Absolute: 0.7 10*3/uL (ref 0.1–0.9)
NEUTROS ABS: 4.9 10*3/uL (ref 1.4–7.0)
Neutrophils: 54 %
PLATELETS: 273 10*3/uL (ref 150–379)
RBC: 5.06 x10E6/uL (ref 4.14–5.80)
RDW: 13.4 % (ref 12.3–15.4)
WBC: 9 10*3/uL (ref 3.4–10.8)

## 2016-12-25 LAB — LIPID PANEL
CHOLESTEROL TOTAL: 170 mg/dL (ref 100–199)
Chol/HDL Ratio: 4.4 ratio (ref 0.0–5.0)
HDL: 39 mg/dL — ABNORMAL LOW (ref >39)
LDL Calculated: 106 mg/dL — ABNORMAL HIGH (ref 0–99)
TRIGLYCERIDES: 126 mg/dL (ref 0–149)
VLDL Cholesterol Cal: 25 mg/dL (ref 5–40)

## 2016-12-25 LAB — COMPREHENSIVE METABOLIC PANEL
ALBUMIN: 4.5 g/dL (ref 3.5–5.5)
ALT: 30 IU/L (ref 0–44)
AST: 21 IU/L (ref 0–40)
Albumin/Globulin Ratio: 1.7 (ref 1.2–2.2)
Alkaline Phosphatase: 79 IU/L (ref 39–117)
BUN / CREAT RATIO: 11 (ref 9–20)
BUN: 13 mg/dL (ref 6–24)
Bilirubin Total: 0.6 mg/dL (ref 0.0–1.2)
CALCIUM: 9.4 mg/dL (ref 8.7–10.2)
CO2: 25 mmol/L (ref 20–29)
CREATININE: 1.23 mg/dL (ref 0.76–1.27)
Chloride: 102 mmol/L (ref 96–106)
GFR calc Af Amer: 80 mL/min/{1.73_m2} (ref >59)
GFR calc non Af Amer: 69 mL/min/{1.73_m2} (ref >59)
GLUCOSE: 88 mg/dL (ref 65–99)
Globulin, Total: 2.7 g/dL (ref 1.5–4.5)
Potassium: 4.5 mmol/L (ref 3.5–5.2)
Sodium: 142 mmol/L (ref 134–144)
Total Protein: 7.2 g/dL (ref 6.0–8.5)

## 2016-12-25 LAB — HEMOGLOBIN A1C
Est. average glucose Bld gHb Est-mCnc: 103 mg/dL
Hgb A1c MFr Bld: 5.2 % (ref 4.8–5.6)

## 2016-12-25 LAB — TSH: TSH: 2.18 u[IU]/mL (ref 0.450–4.500)

## 2016-12-25 LAB — VITAMIN D 25 HYDROXY (VIT D DEFICIENCY, FRACTURES): Vit D, 25-Hydroxy: 23 ng/mL — ABNORMAL LOW (ref 30.0–100.0)

## 2016-12-26 ENCOUNTER — Other Ambulatory Visit: Payer: Self-pay | Admitting: Adult Health

## 2016-12-26 DIAGNOSIS — E559 Vitamin D deficiency, unspecified: Secondary | ICD-10-CM

## 2016-12-26 MED ORDER — VITAMIN D (ERGOCALCIFEROL) 1.25 MG (50000 UNIT) PO CAPS: 50000.0000 [IU] | ORAL_CAPSULE | ORAL | 0 refills | Status: DC

## 2016-12-31 ENCOUNTER — Ambulatory Visit (INDEPENDENT_AMBULATORY_CARE_PROVIDER_SITE_OTHER): Payer: 59 | Admitting: Adult Health

## 2016-12-31 ENCOUNTER — Encounter: Payer: Self-pay | Admitting: Adult Health

## 2016-12-31 VITALS — BP 130/79 | HR 66 | Temp 98.1°F | Ht 71.0 in | Wt 275.9 lb

## 2016-12-31 DIAGNOSIS — F418 Other specified anxiety disorders: Secondary | ICD-10-CM | POA: Diagnosis not present

## 2016-12-31 DIAGNOSIS — I10 Essential (primary) hypertension: Secondary | ICD-10-CM

## 2016-12-31 DIAGNOSIS — Z0000 Encounter for general adult medical examination without abnormal findings: Secondary | ICD-10-CM

## 2016-12-31 NOTE — Patient Instructions (Addendum)
Hypertension Hypertension, commonly called high blood pressure, is when the force of blood pumping through the arteries is too strong. The arteries are the blood vessels that carry blood from the heart throughout the body. Hypertension forces the heart to work harder to pump blood and may cause arteries to become narrow or stiff. Having untreated or uncontrolled hypertension can cause heart attacks, strokes, kidney disease, and other problems. A blood pressure reading consists of a higher number over a lower number. Ideally, your blood pressure should be below 120/80. The first ("top") number is called the systolic pressure. It is a measure of the pressure in your arteries as your heart beats. The second ("bottom") number is called the diastolic pressure. It is a measure of the pressure in your arteries as the heart relaxes. What are the causes? The cause of this condition is not known. What increases the risk? Some risk factors for high blood pressure are under your control. Others are not. Factors you can change  Smoking.  Having type 2 diabetes mellitus, high cholesterol, or both.  Not getting enough exercise or physical activity.  Being overweight.  Having too much fat, sugar, calories, or salt (sodium) in your diet.  Drinking too much alcohol. Factors that are difficult or impossible to change  Having chronic kidney disease.  Having a family history of high blood pressure.  Age. Risk increases with age.  Race. You may be at higher risk if you are African-American.  Gender. Men are at higher risk than women before age 45. After age 65, women are at higher risk than men.  Having obstructive sleep apnea.  Stress. What are the signs or symptoms? Extremely high blood pressure (hypertensive crisis) may cause:  Headache.  Anxiety.  Shortness of breath.  Nosebleed.  Nausea and vomiting.  Severe chest pain.  Jerky movements you cannot control (seizures).  How is this  diagnosed? This condition is diagnosed by measuring your blood pressure while you are seated, with your arm resting on a surface. The cuff of the blood pressure monitor will be placed directly against the skin of your upper arm at the level of your heart. It should be measured at least twice using the same arm. Certain conditions can cause a difference in blood pressure between your right and left arms. Certain factors can cause blood pressure readings to be lower or higher than normal (elevated) for a short period of time:  When your blood pressure is higher when you are in a health care provider's office than when you are at home, this is called white coat hypertension. Most people with this condition do not need medicines.  When your blood pressure is higher at home than when you are in a health care provider's office, this is called masked hypertension. Most people with this condition may need medicines to control blood pressure.  If you have a high blood pressure reading during one visit or you have normal blood pressure with other risk factors:  You may be asked to return on a different day to have your blood pressure checked again.  You may be asked to monitor your blood pressure at home for 1 week or longer.  If you are diagnosed with hypertension, you may have other blood or imaging tests to help your health care provider understand your overall risk for other conditions. How is this treated? This condition is treated by making healthy lifestyle changes, such as eating healthy foods, exercising more, and reducing your alcohol intake. Your   health care provider may prescribe medicine if lifestyle changes are not enough to get your blood pressure under control, and if:  Your systolic blood pressure is above 130.  Your diastolic blood pressure is above 80.  Your personal target blood pressure may vary depending on your medical conditions, your age, and other factors. Follow these  instructions at home: Eating and drinking  Eat a diet that is high in fiber and potassium, and low in sodium, added sugar, and fat. An example eating plan is called the DASH (Dietary Approaches to Stop Hypertension) diet. To eat this way: ? Eat plenty of fresh fruits and vegetables. Try to fill half of your plate at each meal with fruits and vegetables. ? Eat whole grains, such as whole wheat pasta, brown rice, or whole grain bread. Fill about one quarter of your plate with whole grains. ? Eat or drink low-fat dairy products, such as skim milk or low-fat yogurt. ? Avoid fatty cuts of meat, processed or cured meats, and poultry with skin. Fill about one quarter of your plate with lean proteins, such as fish, chicken without skin, beans, eggs, and tofu. ? Avoid premade and processed foods. These tend to be higher in sodium, added sugar, and fat.  Reduce your daily sodium intake. Most people with hypertension should eat less than 1,500 mg of sodium a day.  Limit alcohol intake to no more than 1 drink a day for nonpregnant women and 2 drinks a day for men. One drink equals 12 oz of beer, 5 oz of wine, or 1 oz of hard liquor. Lifestyle  Work with your health care provider to maintain a healthy body weight or to lose weight. Ask what an ideal weight is for you.  Get at least 30 minutes of exercise that causes your heart to beat faster (aerobic exercise) most days of the week. Activities may include walking, swimming, or biking.  Include exercise to strengthen your muscles (resistance exercise), such as pilates or lifting weights, as part of your weekly exercise routine. Try to do these types of exercises for 30 minutes at least 3 days a week.  Do not use any products that contain nicotine or tobacco, such as cigarettes and e-cigarettes. If you need help quitting, ask your health care provider.  Monitor your blood pressure at home as told by your health care provider.  Keep all follow-up visits as  told by your health care provider. This is important. Medicines  Take over-the-counter and prescription medicines only as told by your health care provider. Follow directions carefully. Blood pressure medicines must be taken as prescribed.  Do not skip doses of blood pressure medicine. Doing this puts you at risk for problems and can make the medicine less effective.  Ask your health care provider about side effects or reactions to medicines that you should watch for. Contact a health care provider if:  You think you are having a reaction to a medicine you are taking.  You have headaches that keep coming back (recurring).  You feel dizzy.  You have swelling in your ankles.  You have trouble with your vision. Get help right away if:  You develop a severe headache or confusion.  You have unusual weakness or numbness.  You feel faint.  You have severe pain in your chest or abdomen.  You vomit repeatedly.  You have trouble breathing. Summary  Hypertension is when the force of blood pumping through your arteries is too strong. If this condition is not   controlled, it may put you at risk for serious complications.  Your personal target blood pressure may vary depending on your medical conditions, your age, and other factors. For most people, a normal blood pressure is less than 120/80.  Hypertension is treated with lifestyle changes, medicines, or a combination of both. Lifestyle changes include weight loss, eating a healthy, low-sodium diet, exercising more, and limiting alcohol. This information is not intended to replace advice given to you by your health care provider. Make sure you discuss any questions you have with your health care provider. Document Released: 02/05/2005 Document Revised: 01/04/2016 Document Reviewed: 01/04/2016 Elsevier Interactive Patient Education  2018 [employer].   Exercising to [employer] Exercising can help you to lose weight. In order to lose  weight through exercise, you need to do vigorous-intensity exercise. You can tell that you are exercising with vigorous intensity if you are breathing very hard and fast and cannot hold a conversation while exercising. Moderate-intensity exercise helps to maintain your current weight. You can tell that you are exercising at a moderate level if you have a higher heart rate and faster breathing, but you are still able to hold a conversation. How often should I exercise? Choose an activity that you enjoy and set realistic goals. Your health care provider can help you to make an activity plan that works for you. Exercise regularly as directed by your health care provider. This may include:  Doing resistance training twice each week, such as: ? Push-ups. ? Sit-ups. ? Lifting weights. ? Using resistance bands.  Doing a given intensity of exercise for a given amount of time. Choose from these options: ? 150 minutes of moderate-intensity exercise every week. ? 75 minutes of vigorous-intensity exercise every week. ? A mix of moderate-intensity and vigorous-intensity exercise every week.  Children, pregnant women, people who are out of shape, people who are overweight, and older adults may need to consult a health care provider for individual recommendations. If you have any sort of medical condition, be sure to consult your health care provider before starting a new exercise program. What are some activities that can help me to lose weight?  Walking at a rate of at least 4.5 miles an hour.  Jogging or running at a rate of 5 miles per hour.  Biking at a rate of at least 10 miles per hour.  Lap swimming.  Roller-skating or in-line skating.  Cross-country skiing.  Vigorous competitive sports, such as football, basketball, and soccer.  Jumping rope.  Aerobic dancing. How can I be more active in my day-to-day activities?  Use the stairs instead of the elevator.  Take a walk during your  lunch break.  If you drive, park your car farther away from work or school.  If you take public transportation, get off one stop early and walk the rest of the way.  Make all of your phone calls while standing up and walking around.  Get up, stretch, and walk around every 30 minutes throughout the day. What guidelines should I follow while exercising?  Do not exercise so much that you hurt yourself, feel dizzy, or get very short of breath.  Consult your health care provider prior to starting a new exercise program.  Wear comfortable clothes and shoes with good support.  Drink plenty of water while you exercise to prevent dehydration or heat stroke. Body water is lost during exercise and must be replaced.  Work out until you breathe faster and your heart beats faster.  This information is not intended to replace advice given to you by your health care provider. Make sure you discuss any questions you have with your health care provider. Document Released: 03/10/2010 Document Revised: 07/14/2015 Document Reviewed: 07/09/2013 Elsevier Interactive Patient Education  2018 [place] Eating Plan DASH stands for "Dietary Approaches to Stop Hypertension." The DASH eating plan is a healthy eating plan that has been shown to reduce high blood pressure (hypertension). It may also reduce your risk for type 2 diabetes, heart disease, and stroke. The DASH eating plan may also help with weight loss. What are tips for following this plan? General guidelines  Avoid eating more than 2,300 mg (milligrams) of salt (sodium) a day. If you have hypertension, you may need to reduce your sodium intake to 1,500 mg a day.  Limit alcohol intake to no more than 1 drink a day for nonpregnant women and 2 drinks a day for men. One drink equals 12 oz of beer, 5 oz of wine, or 1 oz of hard liquor.  Work with your health care provider to maintain a healthy body weight or to lose weight. Ask what an ideal  weight is for you.  Get at least 30 minutes of exercise that causes your heart to beat faster (aerobic exercise) most days of the week. Activities may include walking, swimming, or biking.  Work with your health care provider or diet and nutrition specialist (dietitian) to adjust your eating plan to your individual calorie needs. Reading food labels  Check food labels for the amount of sodium per serving. Choose foods with less than 5 percent of the Daily Value of sodium. Generally, foods with less than 300 mg of sodium per serving fit into this eating plan.  To find whole grains, look for the word "whole" as the first word in the ingredient list. Shopping  Buy products labeled as "low-sodium" or "no salt added."  Buy fresh foods. Avoid canned foods and premade or frozen meals. Cooking  Avoid adding salt when cooking. Use salt-free seasonings or herbs instead of table salt or sea salt. Check with your health care provider or pharmacist before using salt substitutes.  Do not fry foods. Cook foods using healthy methods such as baking, boiling, grilling, and broiling instead.  Cook with heart-healthy oils, such as olive, canola, soybean, or sunflower oil. Meal planning   Eat a balanced diet that includes: ? 5 or more servings of fruits and vegetables each day. At each meal, try to fill half of your plate with fruits and vegetables. ? Up to 6-8 servings of whole grains each day. ? Less than 6 oz of lean meat, poultry, or fish each day. A 3-oz serving of meat is about the same size as a deck of cards. One egg equals 1 oz. ? 2 servings of low-fat dairy each day. ? A serving of nuts, seeds, or beans 5 times each week. ? Heart-healthy fats. Healthy fats called Omega-3 fatty acids are found in foods such as flaxseeds and coldwater fish, like sardines, salmon, and mackerel.  Limit how much you eat of the following: ? Canned or prepackaged foods. ? Food that is high in trans fat, such as  fried foods. ? Food that is high in saturated fat, such as fatty meat. ? Sweets, desserts, sugary drinks, and other foods with added sugar. ? Full-fat dairy products.  Do not salt foods before eating.  Try to eat at least 2 vegetarian meals each week.  Eat  more home-cooked food and less restaurant, buffet, and fast food.  When eating at a restaurant, ask that your food be prepared with less salt or no salt, if possible. What foods are recommended? The items listed may not be a complete list. Talk with your dietitian about what dietary choices are best for you. Grains Whole-grain or whole-wheat bread. Whole-grain or whole-wheat pasta. Brown rice. [place]. Bulgur. Whole-grain and low-sodium cereals. Pita bread. Low-fat, low-sodium crackers. Whole-wheat flour tortillas. Vegetables Fresh or frozen vegetables (raw, steamed, roasted, or grilled). Low-sodium or reduced-sodium tomato and vegetable juice. Low-sodium or reduced-sodium tomato sauce and tomato paste. Low-sodium or reduced-sodium canned vegetables. Fruits All fresh, dried, or frozen fruit. Canned fruit in natural juice (without added sugar). Meat and other protein foods Skinless chicken or [place]. Ground chicken or [place]. Pork with fat trimmed off. Fish and seafood. Egg whites. Dried beans, peas, or lentils. Unsalted nuts, nut butters, and seeds. Unsalted canned beans. Lean cuts of beef with fat trimmed off. Low-sodium, lean deli meat. Dairy Low-fat (1%) or fat-free (skim) milk. Fat-free, low-fat, or reduced-fat cheeses. Nonfat, low-sodium ricotta or cottage cheese. Low-fat or nonfat yogurt. Low-fat, low-sodium cheese. Fats and oils Soft margarine without trans fats. Vegetable oil. Low-fat, reduced-fat, or light mayonnaise and salad dressings (reduced-sodium). Canola, safflower, olive, soybean, and sunflower oils. Avocado. Seasoning and other foods Herbs. Spices. Seasoning mixes without salt. Unsalted popcorn and pretzels.  Fat-free sweets. What foods are not recommended? The items listed may not be a complete list. Talk with your dietitian about what dietary choices are best for you. Grains Baked goods made with fat, such as croissants, muffins, or some breads. Dry pasta or rice meal packs. Vegetables Creamed or fried vegetables. Vegetables in a cheese sauce. Regular canned vegetables (not low-sodium or reduced-sodium). Regular canned tomato sauce and paste (not low-sodium or reduced-sodium). Regular tomato and vegetable juice (not low-sodium or reduced-sodium). Angie Fava. Olives. Fruits Canned fruit in a light or heavy syrup. Fried fruit. Fruit in cream or butter sauce. Meat and other protein foods Fatty cuts of meat. Ribs. Fried meat. Berniece Salines. Sausage. Bologna and other processed lunch meats. Salami. Fatback. Hotdogs. Bratwurst. Salted nuts and seeds. Canned beans with added salt. Canned or smoked fish. Whole eggs or egg yolks. Chicken or [place] with skin. Dairy Whole or 2% milk, cream, and half-and-half. Whole or full-fat cream cheese. Whole-fat or sweetened yogurt. Full-fat cheese. Nondairy creamers. Whipped toppings. Processed cheese and cheese spreads. Fats and oils Butter. Stick margarine. Lard. Shortening. Ghee. Bacon fat. Tropical oils, such as coconut, palm kernel, or palm oil. Seasoning and other foods Salted popcorn and pretzels. Onion salt, garlic salt, seasoned salt, table salt, and sea salt. Worcestershire sauce. Tartar sauce. Barbecue sauce. Teriyaki sauce. Soy sauce, including reduced-sodium. Steak sauce. Canned and packaged gravies. Fish sauce. Oyster sauce. Cocktail sauce. Horseradish that you find on the shelf. Ketchup. Mustard. Meat flavorings and tenderizers. Bouillon cubes. Hot sauce and Tabasco sauce. Premade or packaged marinades. Premade or packaged taco seasonings. Relishes. Regular salad dressings. Where to find more information:  National Heart, Lung, and [place]:  https://wilson-eaton.com/  American Heart Association: www.heart.org Summary  The DASH eating plan is a healthy eating plan that has been shown to reduce high blood pressure (hypertension). It may also reduce your risk for type 2 diabetes, heart disease, and stroke.  With the DASH eating plan, you should limit salt (sodium) intake to 2,300 mg a day. If you have hypertension, you may need to reduce your sodium intake to  1,500 mg a day.  When on the DASH eating plan, aim to eat more fresh fruits and vegetables, whole grains, lean proteins, low-fat dairy, and heart-healthy fats.  Work with your health care provider or diet and nutrition specialist (dietitian) to adjust your eating plan to your individual calorie needs. This information is not intended to replace advice given to you by your health care provider. Make sure you discuss any questions you have with your health care provider. Document Released: 01/25/2011 Document Revised: 01/30/2016 Document Reviewed: 01/30/2016 Elsevier Interactive Patient Education  2017 [place] all medications as directed. Increase water intake, strive for at least 140  ounces/day.   Follow Heart Healthy diet Increase regular exercise.  Recommend at least 30 minutes daily, 5 days per week of walking, jogging, biking, swimming, YouTube/Pinterest workout videos. Please reduce sodium intake and again increase water drinking. Please schedule follow-up in 6 weeks, to re-check blood pressure and weight. If weight loss has not improved then, will discuss mediation to aid in appetite/weight loss. NICE TO SEE YOU!

## 2016-12-31 NOTE — Assessment & Plan Note (Addendum)
BP at goal 130/79, HR 66 Continue Lisinopril 10mg  daily. Follow Heart Healthy Diet and increase intensity of exercise. Continue to avoid tobacco use.

## 2016-12-31 NOTE — Assessment & Plan Note (Signed)
Discussed at length techniques to reduce anxiety. He declined CBT referral. Sig family hx of anxiety- mother and brother (who is unable to work r/t severity of sx's).

## 2016-12-31 NOTE — Assessment & Plan Note (Addendum)
Declined Guaiac testing today. Colonoscopy not indicated until age 46 BMI 38.48, has maintained 20 lb wt loss over last 12 months. Reviewed recent labs at length- all questions/concerns answered. Continue all medications as directed. Increase water intake, strive for at least 140  ounces/day.   Follow Heart Healthy diet Increase regular exercise.  Recommend at least 30 minutes daily, 5 days per week of walking, jogging, biking, swimming, YouTube/Pinterest workout videos. Please reduce sodium intake and again increase water drinking. Please schedule follow-up in 6 weeks, to re-check blood pressure and weight. If weight loss has not improved then, will discuss mediation to aid in appetite/weight loss.

## 2016-12-31 NOTE — Progress Notes (Signed)
Subjective:    Patient ID: [name] [name], male    DOB: [birth year], 46 y.o.   MRN: [id number]  HPI: 10/24/2016  OV: [name] [name] presents to establish as a new pt.  He is a very pleasant 46 year old male. PMH: HTN, obesity, and productive cough that developed last week after pitching during softball practice in 100 degree heat. He has also had clear nasal drainage and "post nasal gtt".  He has used a few doses of Therflu and feels that it help resolve his acute sx's. He denies tobacco/EOTH use.  He denies also states "I think I might developing some anxiety as a get older".  He recently started reducing soda and increasing water intake and estimates to drink 40 ounces water/day.  He has been reducing saturated fat and CHO intake as well.  He has lost 20 lbs I last 12 months and hopes to loss another 75 lbs. He denies CP/dyspnea/palpipations.  11/26/2016 Note: [name] [name] is here for /f/u HTN.  At initial OV BP was well above goal, BMP drawn and started on Lisinopril 10mg .  He reports medication compliance and denies SE.  He continues to walk 33miles day and has been following a heart healthy diet (minus yesterday at family reunion buffet).  He does not check his BP at home   12/20/2016 Note: [name] [name] is here for CPE.  He reports taking Lisinopril 10mg  daily and has started once weekly vit d. He continues to walk >2 miles 5 days a week and is quite frustrated that he has not lost any additional wt since last OV. He estimates to drink 40-60 oz water/day, with afternoon sweat tea and occasional coke.  He denies tobacco/ETOH use.   He eats the same meals daily- breakfast grits, lunch salmon   Healthcare Maintenance: Colonoscopy  Patient Care Team    Relationship Specialty Notifications Start End  Esaw Grandchild, NP PCP - General Family Medicine  10/24/16     Patient Active Problem List   Diagnosis Date Noted  . Anxiety about health 12/31/2016  . Hypertension 10/24/2016  . Healthcare  maintenance 10/24/2016  . Seasonal allergies 10/24/2016  . Lumbar back pain with radiculopathy affecting left lower extremity 06/05/2016  . Left knee pain 06/05/2016  . Hypokalemia 02/10/2014  . Pancreatitis 02/05/2014  . Acute pancreatitis 02/05/2014  . Cholelithiasis 02/05/2014  . Hepatic steatosis 02/05/2014  . Elevated LFTs 02/05/2014     Past Medical History:  Diagnosis Date  . Anxiety   . Arthritis    hip pain from time to time   . Hernia of abdominal cavity "born w/it"  . Kidney stones      Past Surgical History:  Procedure Laterality Date  . HERNIA REPAIR    . URETEROLITHOTOMY  1989     Family History  Problem Relation Age of Onset  . Asthma Mother   . COPD Mother   . Cancer Father        bone  . Glaucoma Father   . Healthy Brother      Social History   Substance and Sexual Activity  Drug Use No     Social History   Substance and Sexual Activity  Alcohol Use No     Social History   Tobacco Use  Smoking Status Never Smoker  Smokeless Tobacco Never Used     Outpatient Encounter Medications as of 12/31/2016  Medication Sig  . acetaminophen (TYLENOL) 325 MG tablet Take 650 mg by mouth every 6 (  six) hours as needed for mild pain.  Marland Kitchen lisinopril (PRINIVIL,ZESTRIL) 10 MG tablet Take 1 tablet (10 mg total) by mouth daily.  . Vitamin D, Ergocalciferol, (DRISDOL) 50000 units CAPS capsule Take 1 capsule (50,000 Units total) every 7 (seven) days by mouth.   No facility-administered encounter medications on file as of 12/31/2016.     Allergies: Prednisone  Body mass index is 38.48 kg/m.  Blood pressure 130/79, pulse 66, temperature 98.1 F (36.7 C), height 5\' 11"  (1.803 m), weight 275 lb 14.4 oz (125.1 kg), SpO2 99 %.     Review of Systems  Constitutional: Positive for fatigue. Negative for activity change, appetite change, chills, diaphoresis, fever and unexpected weight change.  HENT: Negative for congestion, postnasal drip, sneezing,  trouble swallowing and voice change.   Eyes: Negative for visual disturbance.  Respiratory: Negative for cough, chest tightness, shortness of breath, wheezing and stridor.   Cardiovascular: Negative for chest pain, palpitations and leg swelling.  Gastrointestinal: Negative for abdominal distention, abdominal pain, blood in stool, constipation, diarrhea, nausea and vomiting.  Endocrine: Negative for cold intolerance, heat intolerance, polydipsia, polyphagia and polyuria.  Genitourinary: Negative for difficulty urinating and flank pain.  Musculoskeletal: Positive for arthralgias.       L knee pain r/t injury in April 2018  Skin: Negative for color change, pallor, rash and wound.  Neurological: Negative for dizziness, tremors, weakness and headaches.  Hematological: Does not bruise/bleed easily.  Psychiatric/Behavioral: Negative for decreased concentration, self-injury, sleep disturbance and suicidal ideas. The patient is nervous/anxious.        Objective:   Physical Exam  Constitutional: He is oriented to person, place, and time. He appears well-developed and well-nourished. No distress.  HENT:  Head: Normocephalic and atraumatic.  Right Ear: Hearing and external ear normal. Tympanic membrane is not erythematous and not bulging. No decreased hearing is noted.  Left Ear: Hearing, external ear and ear canal normal. Tympanic membrane is not erythematous and not bulging. No decreased hearing is noted.  Nose: Mucosal edema and rhinorrhea present. Right sinus exhibits no maxillary sinus tenderness and no frontal sinus tenderness. Left sinus exhibits no maxillary sinus tenderness and no frontal sinus tenderness.  Mouth/Throat: Uvula is midline and mucous membranes are normal. No oropharyngeal exudate, posterior oropharyngeal edema or posterior oropharyngeal erythema.  Clear drainage noted at back of throat  Eyes: Conjunctivae are normal. Pupils are equal, round, and reactive to light.  Neck: Normal  range of motion. Neck supple.  Cardiovascular: Normal rate, regular rhythm, normal heart sounds and intact distal pulses.  No murmur heard. Pulmonary/Chest: Effort normal and breath sounds normal. No respiratory distress. He has no wheezes. He has no rales. He exhibits no tenderness.  Abdominal: Soft. Bowel sounds are normal. He exhibits no distension and no mass. There is no tenderness. There is no rebound and no guarding.  Genitourinary:  Genitourinary Comments: Declined DRE and Guaiac testing  Musculoskeletal: He exhibits tenderness. He exhibits no edema.       Left knee: He exhibits normal range of motion. Tenderness found. Medial joint line and patellar tendon tenderness noted.  Lymphadenopathy:    He has no cervical adenopathy.  Neurological: He is alert and oriented to person, place, and time. He displays normal reflexes. No cranial nerve deficit. He exhibits normal muscle tone. Coordination normal.  Skin: Skin is warm and dry. No rash noted. He is not diaphoretic. No erythema. No pallor.  Psychiatric: His speech is normal and behavior is normal. Judgment and thought content normal.  His mood appears anxious. Cognition and memory are normal.  Nursing note and vitals reviewed.         Assessment & Plan:   1. Anxiety about health   2. Hypertension, unspecified type   3. Healthcare maintenance     Hypertension BP at goal 130/79, HR 66 Continue Lisinopril 10mg  daily. Follow Heart Healthy Diet and increase intensity of exercise. Continue to avoid tobacco use.  Healthcare maintenance Declined Guaiac testing today. Colonoscopy not indicated until age 23 BMI 38.48, has maintained 20 lb wt loss over last 12 months. Reviewed recent labs at length- all questions/concerns answered. Continue all medications as directed. Increase water intake, strive for at least 140  ounces/day.   Follow Heart Healthy diet Increase regular exercise.  Recommend at least 30 minutes daily, 5 days per  week of walking, jogging, biking, swimming, YouTube/Pinterest workout videos. Please reduce sodium intake and again increase water drinking. Please schedule follow-up in 6 weeks, to re-check blood pressure and weight. If weight loss has not improved then, will discuss mediation to aid in appetite/weight loss.  Anxiety about health Discussed at length techniques to reduce anxiety. He declined CBT referral. Sig family hx of anxiety- mother and brother (who is unable to work r/t severity of sx's).     FOLLOW-UP:  Return in about 6 weeks (around 02/11/2017) for Regular Follow Up, HTN, Weight Check.

## 2017-01-21 ENCOUNTER — Other Ambulatory Visit: Payer: Self-pay | Admitting: Adult Health

## 2017-02-13 ENCOUNTER — Ambulatory Visit: Payer: 59 | Admitting: Adult Health

## 2017-02-13 NOTE — Progress Notes (Deleted)
Subjective:    Patient ID: [name] [name], male    DOB: 1971-02-08, 46 y.o.   MRN: [id number]  HPI: 10/24/2016  OV: [name] [name] presents to establish as a new pt.  He is a very pleasant 46 year old male. PMH: HTN, obesity, and productive cough that developed last week after pitching during softball practice in 100 degree heat. He has also had clear nasal drainage and "post nasal gtt".  He has used a few doses of Therflu and feels that it help resolve his acute sx's. He denies tobacco/EOTH use.  He denies also states "I think I might developing some anxiety as a get older".  He recently started reducing soda and increasing water intake and estimates to drink 40 ounces water/day.  He has been reducing saturated fat and CHO intake as well.  He has lost 20 lbs I last 12 months and hopes to loss another 75 lbs. He denies CP/dyspnea/palpipations.  11/26/2016 Note: [name] [name] is here for /f/u HTN.  At initial OV BP was well above goal, BMP drawn and started on Lisinopril 10mg .  He reports medication compliance and denies SE.  He continues to walk 54miles day and has been following a heart healthy diet (minus yesterday at family reunion buffet).  He does not check his BP at home   12/20/2016 Note: [name] [name] is here for CPE.  He reports taking Lisinopril 10mg  daily and has started once weekly vit d. He continues to walk >2 miles 5 days a week and is quite frustrated that he has not lost any additional wt since last OV. He estimates to drink 40-60 oz water/day, with afternoon sweat tea and occasional coke.  He denies tobacco/ETOH use.   He eats the same meals daily- breakfast grits, lunch salmon   Healthcare Maintenance: Colonoscopy  02/13/17 OV:  [name] [name] is here for f/u: HTN and weight. If weight loss has not improved then, will discuss mediation to aid in appetite/weight loss.  Patient Care Team    Relationship Specialty Notifications Start End  Esaw Grandchild, NP PCP - General Family  Medicine  10/24/16     Patient Active Problem List   Diagnosis Date Noted  . Anxiety about health 12/31/2016  . Hypertension 10/24/2016  . Healthcare maintenance 10/24/2016  . Seasonal allergies 10/24/2016  . Lumbar back pain with radiculopathy affecting left lower extremity 06/05/2016  . Left knee pain 06/05/2016  . Hypokalemia 02/10/2014  . Pancreatitis 02/05/2014  . Acute pancreatitis 02/05/2014  . Cholelithiasis 02/05/2014  . Hepatic steatosis 02/05/2014  . Elevated LFTs 02/05/2014     Past Medical History:  Diagnosis Date  . Anxiety   . Arthritis    hip pain from time to time   . Hernia of abdominal cavity "born w/it"  . Kidney stones      Past Surgical History:  Procedure Laterality Date  . CHOLECYSTECTOMY N/A 02/08/2014   Procedure: ATTEMPTED LAPAROSCOPIC CHOLECYSTECTOMY;  Surgeon: [name], MD;  Location: [facility];  Service: General;  Laterality: N/A;  . CHOLECYSTECTOMY N/A 02/08/2014   rocedure: CHOLECYSTECTOMY;  Surgeon: [name], MD;  Location: [facility];  Service: General;  Laterality: N/A;  . HERNIA REPAIR    . INSERTION OF MESH N/A 09/29/2015   Procedure: INSERTION OF MESH;  Surgeon: [name], MD;  Location: [facility];  Service: General;  Laterality: N/A;  . URETEROLITHOTOMY  1989  . VENTRAL HERNIA REPAIR N/A 09/29/2015   Procedure: LAPAROSCOPIC VENTRAL HERNIA;  Surgeon: [name],  MD;  Location: [facility];  Service: General;  Laterality: N/A;     Family History  Problem Relation Age of Onset  . Asthma Mother   . COPD Mother   . Cancer Father        bone  . Glaucoma Father   . Healthy Brother      Social History   Substance and Sexual Activity  Drug Use No     Social History   Substance and Sexual Activity  Alcohol Use No     Social History   Tobacco Use  Smoking Status Never Smoker  Smokeless Tobacco Never Used     Outpatient Encounter Medications as of 02/13/2017  Medication Sig  . acetaminophen (TYLENOL) 325 MG  tablet Take 650 mg by mouth every 6 (six) hours as needed for mild pain.  Marland Kitchen lisinopril (PRINIVIL,ZESTRIL) 10 MG tablet TAKE 1 TABLET BY MOUTH DAILY  . Vitamin D, Ergocalciferol, (DRISDOL) 50000 units CAPS capsule Take 1 capsule (50,000 Units total) every 7 (seven) days by mouth.   No facility-administered encounter medications on file as of 02/13/2017.     Allergies: Prednisone  There is no height or weight on file to calculate BMI.  There were no vitals taken for this visit.     Review of Systems  Constitutional: Positive for fatigue. Negative for activity change, appetite change, chills, diaphoresis, fever and unexpected weight change.  HENT: Negative for congestion, postnasal drip, sneezing, trouble swallowing and voice change.   Eyes: Negative for visual disturbance.  Respiratory: Negative for cough, chest tightness, shortness of breath, wheezing and stridor.   Cardiovascular: Negative for chest pain, palpitations and leg swelling.  Gastrointestinal: Negative for abdominal distention, abdominal pain, blood in stool, constipation, diarrhea, nausea and vomiting.  Endocrine: Negative for cold intolerance, heat intolerance, polydipsia, polyphagia and polyuria.  Genitourinary: Negative for difficulty urinating and flank pain.  Musculoskeletal: Positive for arthralgias.       L knee pain r/t injury in April 2018  Skin: Negative for color change, pallor, rash and wound.  Neurological: Negative for dizziness, tremors, weakness and headaches.  Hematological: Does not bruise/bleed easily.  Psychiatric/Behavioral: Negative for decreased concentration, self-injury, sleep disturbance and suicidal ideas. The patient is nervous/anxious.        Objective:   Physical Exam  Constitutional: He is oriented to person, place, and time. He appears well-developed and well-nourished. No distress.  HENT:  Head: Normocephalic and atraumatic.  Right Ear: Hearing and external ear normal. Tympanic  membrane is not erythematous and not bulging. No decreased hearing is noted.  Left Ear: Hearing, external ear and ear canal normal. Tympanic membrane is not erythematous and not bulging. No decreased hearing is noted.  Nose: Mucosal edema and rhinorrhea present. Right sinus exhibits no maxillary sinus tenderness and no frontal sinus tenderness. Left sinus exhibits no maxillary sinus tenderness and no frontal sinus tenderness.  Mouth/Throat: Uvula is midline and mucous membranes are normal. No oropharyngeal exudate, posterior oropharyngeal edema or posterior oropharyngeal erythema.  Clear drainage noted at back of throat  Eyes: Conjunctivae are normal. Pupils are equal, round, and reactive to light.  Neck: Normal range of motion. Neck supple.  Cardiovascular: Normal rate, regular rhythm, normal heart sounds and intact distal pulses.  No murmur heard. Pulmonary/Chest: Effort normal and breath sounds normal. No respiratory distress. He has no wheezes. He has no rales. He exhibits no tenderness.  Abdominal: Soft. Bowel sounds are normal. He exhibits no distension and no mass. There is no tenderness. There  is no rebound and no guarding.  Genitourinary:  Genitourinary Comments: Declined DRE and Guaiac testing  Musculoskeletal: He exhibits tenderness. He exhibits no edema.       Left knee: He exhibits normal range of motion. Tenderness found. Medial joint line and patellar tendon tenderness noted.  Lymphadenopathy:    He has no cervical adenopathy.  Neurological: He is alert and oriented to person, place, and time. He displays normal reflexes. No cranial nerve deficit. He exhibits normal muscle tone. Coordination normal.  Skin: Skin is warm and dry. No rash noted. He is not diaphoretic. No erythema. No pallor.  Psychiatric: His speech is normal and behavior is normal. Judgment and thought content normal. His mood appears anxious. Cognition and memory are normal.  Nursing note and vitals  reviewed.         Assessment & Plan:   No diagnosis found.  No problem-specific Assessment & Plan notes found for this encounter.    FOLLOW-UP:  No Follow-up on file.

## 2017-02-20 ENCOUNTER — Ambulatory Visit (INDEPENDENT_AMBULATORY_CARE_PROVIDER_SITE_OTHER): Payer: 59 | Admitting: Adult Health

## 2017-02-20 ENCOUNTER — Encounter: Payer: Self-pay | Admitting: Adult Health

## 2017-02-20 VITALS — BP 149/94 | HR 97 | Ht 71.0 in | Wt 275.2 lb

## 2017-02-20 DIAGNOSIS — M25562 Pain in left knee: Secondary | ICD-10-CM

## 2017-02-20 DIAGNOSIS — F418 Other specified anxiety disorders: Secondary | ICD-10-CM

## 2017-02-20 DIAGNOSIS — Z0000 Encounter for general adult medical examination without abnormal findings: Secondary | ICD-10-CM

## 2017-02-20 DIAGNOSIS — I10 Essential (primary) hypertension: Secondary | ICD-10-CM

## 2017-02-20 NOTE — Patient Instructions (Addendum)
Heart-Healthy Eating Plan Many factors influence your heart health, including eating and exercise habits. Heart (coronary) risk increases with abnormal blood fat (lipid) levels. Heart-healthy meal planning includes limiting unhealthy fats, increasing healthy fats, and making other small dietary changes. This includes maintaining a healthy body weight to help keep lipid levels within a normal range. What is my plan? Your health care provider recommends that you:  Get no more than __25__% of the total calories in your daily diet from fat.  Limit your intake of saturated fat to less than ___5___% of your total calories each day.  Limit the amount of cholesterol in your diet to less than __300__ mg per day.  What types of fat should I choose?  Choose healthy fats more often. Choose monounsaturated and polyunsaturated fats, such as olive oil and canola oil, flaxseeds, walnuts, almonds, and seeds.  Eat more omega-3 fats. Good choices include salmon, mackerel, sardines, tuna, flaxseed oil, and ground flaxseeds. Aim to eat fish at least two times each week.  Limit saturated fats. Saturated fats are primarily found in animal products, such as meats, butter, and cream. Plant sources of saturated fats include palm oil, palm kernel oil, and coconut oil.  Avoid foods with partially hydrogenated oils in them. These contain trans fats. Examples of foods that contain trans fats are stick margarine, some tub margarines, cookies, crackers, and other baked goods. What general guidelines do I need to follow?  Check food labels carefully to identify foods with trans fats or high amounts of saturated fat.  Fill one half of your plate with vegetables and green salads. Eat 4-5 servings of vegetables per day. A serving of vegetables equals 1 cup of raw leafy vegetables,  cup of raw or cooked cut-up vegetables, or  cup of vegetable juice.  Fill one fourth of your plate with whole grains. Look for the word "whole"  as the first word in the ingredient list.  Fill one fourth of your plate with lean protein foods.  Eat 4-5 servings of fruit per day. A serving of fruit equals one medium whole fruit,  cup of dried fruit,  cup of fresh, frozen, or canned fruit, or  cup of 100% fruit juice.  Eat more foods that contain soluble fiber. Examples of foods that contain this type of fiber are apples, broccoli, carrots, beans, peas, and barley. Aim to get 20-30 g of fiber per day.  Eat more home-cooked food and less restaurant, buffet, and fast food.  Limit or avoid alcohol.  Limit foods that are high in starch and sugar.  Avoid fried foods.  Cook foods by using methods other than frying. Baking, boiling, grilling, and broiling are all great options. Other fat-reducing suggestions include: ? Removing the skin from poultry. ? Removing all visible fats from meats. ? Skimming the fat off of stews, soups, and gravies before serving them. ? Steaming vegetables in water or broth.  Lose weight if you are overweight. Losing just 5-10% of your initial body weight can help your overall health and prevent diseases such as diabetes and heart disease.  Increase your consumption of nuts, legumes, and seeds to 4-5 servings per week. One serving of dried beans or legumes equals  cup after being cooked, one serving of nuts equals 1 ounces, and one serving of seeds equals  ounce or 1 tablespoon.  You may need to monitor your salt (sodium) intake, especially if you have high blood pressure. Talk with your health care provider or dietitian to get  more information about reducing sodium. What foods can I eat? Grains  Breads, including [place], white, pita, wheat, raisin, rye, oatmeal, and [place]. Tortillas that are neither fried nor made with lard or trans fat. Low-fat rolls, including hotdog and hamburger buns and English muffins. Biscuits. Muffins. Waffles. Pancakes. Light popcorn. Whole-grain cereals. Flatbread. Melba  toast. Pretzels. Breadsticks. Rusks. Low-fat snacks and crackers, including oyster, saltine, matzo, graham, animal, and rye. Rice and pasta, including brown rice and those that are made with whole wheat. Vegetables All vegetables. Fruits All fruits, but limit coconut. Meats and Other Protein Sources Lean, well-trimmed beef, veal, pork, and lamb. Chicken and [place] without skin. All fish and shellfish. Wild duck, rabbit, pheasant, and venison. Egg whites or low-cholesterol egg substitutes. Dried beans, peas, lentils, and tofu.Seeds and most nuts. Dairy Low-fat or nonfat cheeses, including ricotta, string, and mozzarella. Skim or 1% milk that is liquid, powdered, or evaporated. Buttermilk that is made with low-fat milk. Nonfat or low-fat yogurt. Beverages Mineral water. Diet carbonated beverages. Sweets and Desserts Sherbets and fruit ices. Honey, jam, marmalade, jelly, and syrups. Meringues and gelatins. Pure sugar candy, such as hard candy, jelly beans, gumdrops, mints, marshmallows, and small amounts of dark chocolate. [employer] Eat all sweets and desserts in moderation. Fats and Oils Nonhydrogenated (trans-free) margarines. Vegetable oils, including soybean, sesame, sunflower, olive, peanut, safflower, corn, canola, and cottonseed. Salad dressings or mayonnaise that are made with a vegetable oil. Limit added fats and oils that you use for cooking, baking, salads, and as spreads. Other Cocoa powder. Coffee and tea. All seasonings and condiments. The items listed above may not be a complete list of recommended foods or beverages. Contact your dietitian for more options. What foods are not recommended? Grains Breads that are made with saturated or trans fats, oils, or whole milk. Croissants. Butter rolls. Cheese breads. Sweet rolls. Donuts. Buttered popcorn. Chow mein noodles. High-fat crackers, such as cheese or butter crackers. Meats and Other Protein Sources Fatty meats, such as  hotdogs, short ribs, sausage, spareribs, bacon, ribeye roast or steak, and mutton. High-fat deli meats, such as salami and bologna. Caviar. Domestic duck and goose. Organ meats, such as kidney, liver, sweetbreads, brains, gizzard, chitterlings, and heart. Dairy Cream, sour cream, cream cheese, and creamed cottage cheese. Whole milk cheeses, including blue (bleu), Monterey Jack, [place], [place], American, [place], Swiss, [place], [place], and [place]. Whole or 2% milk that is liquid, evaporated, or condensed. Whole buttermilk. Cream sauce or high-fat cheese sauce. Yogurt that is made from whole milk. Beverages Regular sodas and drinks with added sugar. Sweets and Desserts Frosting. Pudding. Cookies. Cakes other than angel food cake. Candy that has milk chocolate or white chocolate, hydrogenated fat, butter, coconut, or unknown ingredients. Buttered syrups. Full-fat ice cream or ice cream drinks. Fats and Oils Gravy that has suet, meat fat, or shortening. Cocoa butter, hydrogenated oils, palm oil, coconut oil, palm kernel oil. These can often be found in baked products, candy, fried foods, nondairy creamers, and whipped toppings. Solid fats and shortenings, including bacon fat, salt pork, lard, and butter. Nondairy cream substitutes, such as coffee creamers and sour cream substitutes. Salad dressings that are made of unknown oils, cheese, or sour cream. The items listed above may not be a complete list of foods and beverages to avoid. Contact your dietitian for more information. This information is not intended to replace advice given to you by your health care provider. Make sure you discuss any questions you have with your health care  provider. Document Released: 11/15/2007 Document Revised: 08/26/2015 Document Reviewed: 07/30/2013 Elsevier Interactive Patient Education  2018 [employer].    Exercising to [employer] Exercising can help you to lose weight. In order to lose weight through exercise,  you need to do vigorous-intensity exercise. You can tell that you are exercising with vigorous intensity if you are breathing very hard and fast and cannot hold a conversation while exercising. Moderate-intensity exercise helps to maintain your current weight. You can tell that you are exercising at a moderate level if you have a higher heart rate and faster breathing, but you are still able to hold a conversation. How often should I exercise? Choose an activity that you enjoy and set realistic goals. Your health care provider can help you to make an activity plan that works for you. Exercise regularly as directed by your health care provider. This may include:  Doing resistance training twice each week, such as: ? Push-ups. ? Sit-ups. ? Lifting weights. ? Using resistance bands.  Doing a given intensity of exercise for a given amount of time. Choose from these options: ? 150 minutes of moderate-intensity exercise every week. ? 75 minutes of vigorous-intensity exercise every week. ? A mix of moderate-intensity and vigorous-intensity exercise every week.  Children, pregnant women, people who are out of shape, people who are overweight, and older adults may need to consult a health care provider for individual recommendations. If you have any sort of medical condition, be sure to consult your health care provider before starting a new exercise program. What are some activities that can help me to lose weight?  Walking at a rate of at least 4.5 miles an hour.  Jogging or running at a rate of 5 miles per hour.  Biking at a rate of at least 10 miles per hour.  Lap swimming.  Roller-skating or in-line skating.  Cross-country skiing.  Vigorous competitive sports, such as football, basketball, and soccer.  Jumping rope.  Aerobic dancing. How can I be more active in my day-to-day activities?  Use the stairs instead of the elevator.  Take a walk during your lunch break.  If you drive,  park your car farther away from work or school.  If you take public transportation, get off one stop early and walk the rest of the way.  Make all of your phone calls while standing up and walking around.  Get up, stretch, and walk around every 30 minutes throughout the day. What guidelines should I follow while exercising?  Do not exercise so much that you hurt yourself, feel dizzy, or get very short of breath.  Consult your health care provider prior to starting a new exercise program.  Wear comfortable clothes and shoes with good support.  Drink plenty of water while you exercise to prevent dehydration or heat stroke. Body water is lost during exercise and must be replaced.  Work out until you breathe faster and your heart beats faster. This information is not intended to replace advice given to you by your health care provider. Make sure you discuss any questions you have with your health care provider. Document Released: 03/10/2010 Document Revised: 07/14/2015 Document Reviewed: 07/09/2013 Elsevier Interactive Patient Education  2018 [employer].   Increase water intake, strive for at least 140 ounces/day.   Follow Heart Healthy diet Increase regular exercise.  Recommend at least 30 minutes daily, 5 days per week of walking, jogging, biking, swimming, YouTube/Pinterest workout videos. Please return in 2 months- re-check vit d and  office visit to address blood pressure and weight loss. If you are not <260 lbs, then we will refer you to a Nutritionist. If your anxiety continues to be an issue we will refer you to a mental health provider in 2 months. If your L knee pain does not improve with increased daily stretching and weight loss, we will refer you to PT. NICE TO SEE YOU!

## 2017-02-20 NOTE — Assessment & Plan Note (Signed)
Declined PT referral If pain still bothersome in 2 months, will refer to PT

## 2017-02-20 NOTE — Assessment & Plan Note (Signed)
BP slightly above goal 149, 94, HR 97 He is a quite anxious in clinic today Increase regular exercise and reduce Na++ in diet Continue Lisinopril 10mg  daily

## 2017-02-20 NOTE — Progress Notes (Signed)
Subjective:    Patient ID: [name] [name], male    DOB: 07/26/1970, 47 y.o.   MRN: [id number]  HPI: 10/24/2016  OV: [name] [name] presents to establish as a new pt.  He is a very pleasant 47 year old male. PMH: HTN, obesity, and productive cough that developed last week after pitching during softball practice in 100 degree heat. He has also had clear nasal drainage and "post nasal gtt".  He has used a few doses of Therflu and feels that it help resolve his acute sx's. He denies tobacco/EOTH use.  He denies also states "I think I might developing some anxiety as a get older".  He recently started reducing soda and increasing water intake and estimates to drink 40 ounces water/day.  He has been reducing saturated fat and CHO intake as well.  He has lost 20 lbs I last 12 months and hopes to loss another 75 lbs. He denies CP/dyspnea/palpipations.  11/26/2016 Note: [name] [name] is here for /f/u HTN.  At initial OV BP was well above goal, BMP drawn and started on Lisinopril 10mg .  He reports medication compliance and denies SE.  He continues to walk 64miles day and has been following a heart healthy diet (minus yesterday at family reunion buffet).  He does not check his BP at home   12/31/2016 Note: [name] [name] is here for CPE.  He reports taking Lisinopril 10mg  daily and has started once weekly vit d. He continues to walk >2 miles 5 days a week and is quite frustrated that he has not lost any additional wt since last OV. He estimates to drink 40-60 oz water/day, with afternoon sweat tea and occasional coke.  He denies tobacco/ETOH use.   He eats the same meals daily- breakfast grits, lunch salmon   Healthcare Maintenance: Colonoscopy  02/20/17 OV: [name] [name] here for f/u: HTN, wt check, and anxiety.  He is quite frustrated that he is not losing wt despite walking 2 miles every day (has been doing this >12 months). He reports medication compliance- Lisinopril and ergocalciferol-denies SE. He  reports being "really hungry all the time" and believes "that I don't eat enough".  He continues to have back and L knee pain.  He was advised to f/u with [place] ortho is knee pain begins to impact daily life.  Patient Care Team    Relationship Specialty Notifications Start End  Esaw Grandchild, NP PCP - General Family Medicine  10/24/16     Patient Active Problem List   Diagnosis Date Noted  . Morbid obesity ([place]) 02/20/2017  . Anxiety about health 12/31/2016  . Hypertension 10/24/2016  . Healthcare maintenance 10/24/2016  . Seasonal allergies 10/24/2016  . Lumbar back pain with radiculopathy affecting left lower extremity 06/05/2016  . Left knee pain 06/05/2016  . Hypokalemia 02/10/2014  . Pancreatitis 02/05/2014  . Acute pancreatitis 02/05/2014  . Cholelithiasis 02/05/2014  . Hepatic steatosis 02/05/2014  . Elevated LFTs 02/05/2014     Past Medical History:  Diagnosis Date  . Anxiety   . Arthritis    hip pain from time to time   . Hernia of abdominal cavity "born w/it"  . Kidney stones      Past Surgical History:  Procedure Laterality Date  . CHOLECYSTECTOMY N/A 02/08/2014   Procedure: ATTEMPTED LAPAROSCOPIC CHOLECYSTECTOMY;  Surgeon: [name], MD;  Location: [place];  Service: General;  Laterality: N/A;  . CHOLECYSTECTOMY N/A 02/08/2014   rocedure: CHOLECYSTECTOMY;  Surgeon: [name], MD;  Location:  [facility];  Service: General;  Laterality: N/A;  . HERNIA REPAIR    . INSERTION OF MESH N/A 09/29/2015   Procedure: INSERTION OF MESH;  Surgeon: [name], MD;  Location: [facility];  Service: General;  Laterality: N/A;  . URETEROLITHOTOMY  1989  . VENTRAL HERNIA REPAIR N/A 09/29/2015   Procedure: LAPAROSCOPIC VENTRAL HERNIA;  Surgeon: [name], MD;  Location: [facility];  Service: General;  Laterality: N/A;     Family History  Problem Relation Age of [place]set  . Asthma Mother   . COPD Mother   . Cancer Father        bone  . Glaucoma Father   . Healthy Brother       Social History   Substance and Sexual Activity  Drug Use No     Social History   Substance and Sexual Activity  Alcohol Use No     Social History   Tobacco Use  Smoking Status Never Smoker  Smokeless Tobacco Never Used     Outpatient Encounter Medications as of 02/20/2017  Medication Sig  . acetaminophen (TYLENOL) 325 MG tablet Take 650 mg by mouth every 6 (six) hours as needed for mild pain.  Marland Kitchen lisinopril (PRINIVIL,ZESTRIL) 10 MG tablet TAKE 1 TABLET BY MOUTH DAILY  . Vitamin D, Ergocalciferol, (DRISDOL) 50000 units CAPS capsule Take 1 capsule (50,000 Units total) every 7 (seven) days by mouth.   No facility-administered encounter medications on file as of 02/20/2017.     Allergies: Prednisone  Body mass index is 38.38 kg/m.  Blood pressure (!) 149/94, pulse 97, height 5\' 11"  (1.803 m), weight 275 lb 3.2 oz (124.8 kg), SpO2 96 %.     Review of Systems  Constitutional: Positive for fatigue. Negative for activity change, appetite change, chills, diaphoresis, fever and unexpected weight change.  HENT: Negative for congestion, postnasal drip, sneezing, trouble swallowing and voice change.   Eyes: Negative for visual disturbance.  Respiratory: Negative for cough, chest tightness, shortness of breath, wheezing and stridor.   Cardiovascular: Negative for chest pain, palpitations and leg swelling.  Gastrointestinal: Negative for abdominal distention, abdominal pain, blood in stool, constipation, diarrhea, nausea and vomiting.  Endocrine: Negative for cold intolerance, heat intolerance, polydipsia, polyphagia and polyuria.  Genitourinary: Negative for difficulty urinating and flank pain.  Musculoskeletal: Positive for arthralgias.       L knee pain r/t injury in April 2018  Skin: Negative for color change, pallor, rash and wound.  Neurological: Negative for dizziness, tremors, weakness and headaches.  Hematological: Does not bruise/bleed easily.   Psychiatric/Behavioral: Negative for decreased concentration, self-injury, sleep disturbance and suicidal ideas. The patient is nervous/anxious.        Objective:   Physical Exam  Constitutional: He is oriented to person, place, and time. He appears well-developed and well-nourished. No distress.  HENT:  Head: Normocephalic and atraumatic.  Right Ear: Hearing and external ear normal. Tympanic membrane is not erythematous and not bulging. No decreased hearing is noted.  Left Ear: Hearing, external ear and ear canal normal. Tympanic membrane is not erythematous and not bulging. No decreased hearing is noted.  Nose: Mucosal edema and rhinorrhea present. Right sinus exhibits no maxillary sinus tenderness and no frontal sinus tenderness. Left sinus exhibits no maxillary sinus tenderness and no frontal sinus tenderness.  Mouth/Throat: Uvula is midline and mucous membranes are normal. No oropharyngeal exudate, posterior oropharyngeal edema or posterior oropharyngeal erythema.  Clear drainage noted at back of throat  Eyes: Conjunctivae are normal. Pupils are  equal, round, and reactive to light.  Cardiovascular: Normal rate, regular rhythm, normal heart sounds and intact distal pulses.  No murmur heard. Pulmonary/Chest: Effort normal and breath sounds normal. No respiratory distress. He has no wheezes. He has no rales. He exhibits no tenderness.  Abdominal: He exhibits no distension and no mass. There is no tenderness. There is no rebound and no guarding.  Musculoskeletal: He exhibits tenderness. He exhibits no edema.       Left knee: He exhibits normal range of motion. Tenderness found. Medial joint line and patellar tendon tenderness noted.  Neurological: He is alert and oriented to person, place, and time.  Skin: Skin is warm and dry. No rash noted. He is not diaphoretic. No erythema. No pallor.  Psychiatric: His speech is normal and behavior is normal. Judgment and thought content normal. His  mood appears anxious. Cognition and memory are normal.  Nursing note and vitals reviewed.         Assessment & Plan:   1. Hypertension, unspecified type   2. Anxiety about health   3. Healthcare maintenance   4. Acute pain of left knee   5. Morbid obesity ([place])     Left knee pain Declined PT referral If pain still bothersome in 2 months, will refer to PT   Healthcare maintenance  Increase water intake, strive for at least 140 ounces/day.   Follow Heart Healthy diet Increase regular exercise.  Recommend at least 30 minutes daily, 5 days per week of walking, jogging, biking, swimming, YouTube/Pinterest workout videos. Please return in 2 months- re-check vit d and office visit to address blood pressure and weight loss. If you are not <260 lbs, then we will refer you to a Nutritionist. If your anxiety continues to be an issue we will refer you to a mental health provider in 2 months. If your L knee pain does not improve with increased daily stretching and weight loss, we will refer you to PT.  Anxiety about health Spent >25 minutes discussing current health status and how to reduce wt, improve BP, reduce anxiety/stress, normalize lipids Today he declined- Mental health referral PT referral Nutritionist referffal  Hypertension BP slightly above goal 149, 94, HR 97 He is a quite anxious in clinic today Increase regular exercise and reduce Na++ in diet Continue Lisinopril 10mg  daily  Morbid obesity ([facility]) Walking >2 miles/day for last year Current wt 275 If he is not <260 in 2 months, will refer to nutritionist  Needs to increase frequency/intensity of workouts, he has hit plateau  Pt was in the office today for 25+ minutes, with over 50% time spent in face to face counseling of patient's various medical conditions and in coordination of care  FOLLOW-UP:  Return in about 2 months (around 04/20/2017) for HTN, Anxiety, Obesity.

## 2017-02-20 NOTE — Assessment & Plan Note (Signed)
  Increase water intake, strive for at least 140 ounces/day.   Follow Heart Healthy diet Increase regular exercise.  Recommend at least 30 minutes daily, 5 days per week of walking, jogging, biking, swimming, YouTube/Pinterest workout videos. Please return in 2 months- re-check vit d and office visit to address blood pressure and weight loss. If you are not <260 lbs, then we will refer you to a Nutritionist. If your anxiety continues to be an issue we will refer you to a mental health provider in 2 months. If your L knee pain does not improve with increased daily stretching and weight loss, we will refer you to PT.

## 2017-02-20 NOTE — Assessment & Plan Note (Signed)
Spent >25 minutes discussing current health status and how to reduce wt, improve BP, reduce anxiety/stress, normalize lipids Today he declined- Mental health referral PT referral Nutritionist referffal

## 2017-02-20 NOTE — Assessment & Plan Note (Signed)
Walking >2 miles/day for last year Current wt 275 If he is not <260 in 2 months, will refer to nutritionist  Needs to increase frequency/intensity of workouts, he has hit plateau

## 2017-03-06 ENCOUNTER — Ambulatory Visit: Payer: 59 | Admitting: Adult Health

## 2017-04-22 ENCOUNTER — Ambulatory Visit: Payer: 59 | Admitting: Adult Health

## 2017-04-24 ENCOUNTER — Other Ambulatory Visit: Payer: Self-pay | Admitting: Adult Health

## 2017-05-02 NOTE — Progress Notes (Signed)
Subjective:    Patient ID: [name] [name], male    DOB: [birth year], 47 y.o.   MRN: [id number]  HPI: 10/24/2016  OV: [name] [name] presents to establish as a new pt.  He is a very pleasant 47 year old male. PMH: HTN, obesity, and productive cough that developed last week after pitching during softball practice in 100 degree heat. He has also had clear nasal drainage and "post nasal gtt".  He has used a few doses of Therflu and feels that it help resolve his acute sx's. He denies tobacco/EOTH use.  He denies also states "I think I might developing some anxiety as a get older".  He recently started reducing soda and increasing water intake and estimates to drink 40 ounces water/day.  He has been reducing saturated fat and CHO intake as well.  He has lost 20 lbs I last 12 months and hopes to loss another 75 lbs. He denies CP/dyspnea/palpipations.  11/26/2016 Note: [name] [name] is here for /f/u HTN.  At initial OV BP was well above goal, BMP drawn and started on Lisinopril 10mg .  He reports medication compliance and denies SE.  He continues to walk 79miles day and has been following a heart healthy diet (minus yesterday at family reunion buffet).  He does not check his BP at home   12/31/2016 Note: [name] [name] is here for CPE.  He reports taking Lisinopril 10mg  daily and has started once weekly vit d. He continues to walk >2 miles 5 days a week and is quite frustrated that he has not lost any additional wt since last OV. He estimates to drink 40-60 oz water/day, with afternoon sweat tea and occasional coke.  He denies tobacco/ETOH use.   He eats the same meals daily- breakfast grits, lunch salmon   Healthcare Maintenance: Colonoscopy  02/20/17 OV: [name] [name] here for f/u: HTN, wt check, and anxiety.  He is quite frustrated that he is not losing wt despite walking 2 miles every day (has been doing this >12 months). He reports medication compliance- Lisinopril and ergocalciferol-denies SE. He  reports being "really hungry all the time" and believes "that I don't eat enough".  He continues to have back and L knee pain.  He was advised to f/u with [facility] is knee pain begins to impact daily life.  05/06/17 OV: [name] [name] presents for f/u: HTN, Obesity, Anxiety.  His wt remains 275-277 He walks 2 miles/stretches everyday of week. He reports eating the "pretty much the same breakfast/dinner" daily, has not added in lunch. He reports [facility] has released him to perform "as much exercise as I can tolerate". He will be losing his insurance through his employer at end of month and would like to hold off on referral to nutritionist until new health ins. Is secured. He reports taking Lisinopril 10mg  each morning at 1100- he has not rx this am    Patient Care Team    Relationship Specialty Notifications Start End  Esaw Grandchild, NP PCP - General Family Medicine  10/24/16     Patient Active Problem List   Diagnosis Date Noted  . Morbid obesity ([place]) 02/20/2017  . Anxiety about health 12/31/2016  . Hypertension 10/24/2016  . Healthcare maintenance 10/24/2016  . Seasonal allergies 10/24/2016  . Lumbar back pain with radiculopathy affecting left lower extremity 06/05/2016  . Left knee pain 06/05/2016  . Hypokalemia 02/10/2014  . Pancreatitis 02/05/2014  . Acute pancreatitis 02/05/2014  . Cholelithiasis 02/05/2014  . Hepatic  steatosis 02/05/2014  . Elevated LFTs 02/05/2014     Past Medical History:  Diagnosis Date  . Anxiety   . Arthritis    hip pain from time to time   . Hernia of abdominal cavity "born w/it"  . Kidney stones      Past Surgical History:  Procedure Laterality Date  . CHOLECYSTECTOMY N/A 02/08/2014   Procedure: ATTEMPTED LAPAROSCOPIC CHOLECYSTECTOMY;  Surgeon: [name], MD;  Location: [place];  Service: General;  Laterality: N/A;  . CHOLECYSTECTOMY N/A 02/08/2014   rocedure: CHOLECYSTECTOMY;  Surgeon: [name], MD;  Location: [place];   Service: General;  Laterality: N/A;  . HERNIA REPAIR    . INSERTION OF MESH N/A 09/29/2015   Procedure: INSERTION OF MESH;  Surgeon: [name], MD;  Location: [place];  Service: General;  Laterality: N/A;  . URETEROLITHOTOMY  1989  . VENTRAL HERNIA REPAIR N/A 09/29/2015   Procedure: LAPAROSCOPIC VENTRAL HERNIA;  Surgeon: [name], MD;  Location: [facility];  Service: General;  Laterality: N/A;     Family History  Problem Relation Age of Onset  . Asthma Mother   . COPD Mother   . Cancer Father        bone  . Glaucoma Father   . Healthy Brother      Social History   Substance and Sexual Activity  Drug Use No     Social History   Substance and Sexual Activity  Alcohol Use No     Social History   Tobacco Use  Smoking Status Never Smoker  Smokeless Tobacco Never Used     Outpatient Encounter Medications as of 05/06/2017  Medication Sig  . acetaminophen (TYLENOL) 325 MG tablet Take 650 mg by mouth every 6 (six) hours as needed for mild pain.  Marland Kitchen lisinopril (PRINIVIL,ZESTRIL) 10 MG tablet TAKE 1 TABLET BY MOUTH DAILY  . Vitamin D, Ergocalciferol, (DRISDOL) 50000 units CAPS capsule Take 1 capsule (50,000 Units total) every 7 (seven) days by mouth. (Patient not taking: Reported on 05/06/2017)   No facility-administered encounter medications on file as of 05/06/2017.     Allergies: Prednisone  Body mass index is 38.65 kg/m.  Blood pressure (!) 154/90, pulse 74, height 5' 10.98" (1.803 m), weight 277 lb (125.6 kg), SpO2 96 %.     Review of Systems  Constitutional: Positive for fatigue. Negative for activity change, appetite change, chills, diaphoresis, fever and unexpected weight change.  HENT: Negative for congestion, postnasal drip, sneezing, trouble swallowing and voice change.   Eyes: Negative for visual disturbance.  Respiratory: Negative for cough, chest tightness, shortness of breath, wheezing and stridor.   Cardiovascular: Negative for chest pain,  palpitations and leg swelling.  Gastrointestinal: Negative for abdominal distention, abdominal pain, blood in stool, constipation, diarrhea, nausea and vomiting.  Endocrine: Negative for cold intolerance, heat intolerance, polydipsia, polyphagia and polyuria.  Genitourinary: Negative for difficulty urinating and flank pain.  Musculoskeletal: Positive for arthralgias.       L knee pain r/t injury in April 2018  Skin: Negative for color change, pallor, rash and wound.  Neurological: Negative for dizziness, tremors, weakness and headaches.  Hematological: Does not bruise/bleed easily.  Psychiatric/Behavioral: Negative for decreased concentration, self-injury, sleep disturbance and suicidal ideas. The patient is nervous/anxious.        Objective:   Physical Exam  Constitutional: He is oriented to person, place, and time. He appears well-developed and well-nourished. No distress.  HENT:  Head: Normocephalic and atraumatic.  Right Ear: Hearing and external  ear normal. No decreased hearing is noted.  Left Ear: Hearing, external ear and ear canal normal. No decreased hearing is noted.  Nose: Mucosal edema and rhinorrhea present. Right sinus exhibits no maxillary sinus tenderness and no frontal sinus tenderness. Left sinus exhibits no maxillary sinus tenderness and no frontal sinus tenderness.  Mouth/Throat: Uvula is midline and mucous membranes are normal. No oropharyngeal exudate, posterior oropharyngeal edema or posterior oropharyngeal erythema.  Eyes: Conjunctivae are normal. Pupils are equal, round, and reactive to light.  Cardiovascular: Normal rate, regular rhythm, normal heart sounds and intact distal pulses.  No murmur heard. Pulmonary/Chest: Effort normal and breath sounds normal. No respiratory distress. He has no wheezes. He has no rales. He exhibits no tenderness.  Abdominal: He exhibits no distension and no mass. There is no tenderness. There is no rebound and no guarding.   Musculoskeletal: He exhibits no edema or tenderness.       Left knee: He exhibits normal range of motion.  Neurological: He is alert and oriented to person, place, and time.  Skin: Skin is warm and dry. No rash noted. He is not diaphoretic. No erythema. No pallor.  Psychiatric: His speech is normal and behavior is normal. Judgment and thought content normal. His mood appears anxious. Cognition and memory are normal.  Nursing note and vitals reviewed.         Assessment & Plan:   1. Hypertension, unspecified type   2. Morbid obesity ([place])   3. Healthcare maintenance   4. Vitamin D deficiency     Healthcare maintenance Increase water intake, strive for at least 100 ounces/day.   Follow Heart Healthy diet Increase regular exercise.  Recommend at least 30 minutes daily, 5 days per week of walking, jogging, biking, swimming, YouTube/Pinterest workout videos. Increase intensity/duration of work-outs as tolerated. Continue all medications as directed. Once you get new health insurance, please call clinic and we will send in referral to nutritionist. Follow-up in 4 months, sooner If needed.  Morbid obesity ([place]) .Body mass index is 38.65 kg/m.  Once new health ins secured, please call and we will put in referral to nutritionist  Pt was in the office today for 25+ minutes, with over 50% time spent in face to face counseling of patient's various medical conditions and in coordination of care  FOLLOW-UP:  Return in about 4 months (around 09/05/2017) for Regular Follow Up, HTN, Obesity.

## 2017-05-06 ENCOUNTER — Ambulatory Visit (INDEPENDENT_AMBULATORY_CARE_PROVIDER_SITE_OTHER): Payer: 59 | Admitting: Adult Health

## 2017-05-06 ENCOUNTER — Encounter: Payer: Self-pay | Admitting: Adult Health

## 2017-05-06 VITALS — BP 154/90 | HR 74 | Ht 70.98 in | Wt 277.0 lb

## 2017-05-06 DIAGNOSIS — E559 Vitamin D deficiency, unspecified: Secondary | ICD-10-CM

## 2017-05-06 DIAGNOSIS — Z0000 Encounter for general adult medical examination without abnormal findings: Secondary | ICD-10-CM

## 2017-05-06 DIAGNOSIS — I10 Essential (primary) hypertension: Secondary | ICD-10-CM

## 2017-05-06 NOTE — Patient Instructions (Signed)
Heart-Healthy Eating Plan Heart-healthy meal planning includes:  Limiting unhealthy fats.  Increasing healthy fats.  Making other small dietary changes.  You may need to talk with your doctor or a diet specialist (dietitian) to create an eating plan that is right for you. What types of fat should I choose?  Choose healthy fats. These include olive oil and canola oil, flaxseeds, walnuts, almonds, and seeds.  Eat more omega-3 fats. These include salmon, mackerel, sardines, tuna, flaxseed oil, and ground flaxseeds. Try to eat fish at least twice each week.  Limit saturated fats. ? Saturated fats are often found in animal products, such as meats, butter, and cream. ? Plant sources of saturated fats include palm oil, palm kernel oil, and coconut oil.  Avoid foods with partially hydrogenated oils in them. These include stick margarine, some tub margarines, cookies, crackers, and other baked goods. These contain trans fats. What general guidelines do I need to follow?  Check food labels carefully. Identify foods with trans fats or high amounts of saturated fat.  Fill one half of your plate with vegetables and green salads. Eat 4-5 servings of vegetables per day. A serving of vegetables is: ? 1 cup of raw leafy vegetables. ?  cup of raw or cooked cut-up vegetables. ?  cup of vegetable juice.  Fill one fourth of your plate with whole grains. Look for the word "whole" as the first word in the ingredient list.  Fill one fourth of your plate with lean protein foods.  Eat 4-5 servings of fruit per day. A serving of fruit is: ? One medium whole fruit. ?  cup of dried fruit. ?  cup of fresh, frozen, or canned fruit. ?  cup of 100% fruit juice.  Eat more foods that contain soluble fiber. These include apples, broccoli, carrots, beans, peas, and barley. Try to get 20-30 g of fiber per day.  Eat more home-cooked food. Eat less restaurant, buffet, and fast food.  Limit or avoid  alcohol.  Limit foods high in starch and sugar.  Avoid fried foods.  Avoid frying your food. Try baking, boiling, grilling, or broiling it instead. You can also reduce fat by: ? Removing the skin from poultry. ? Removing all visible fats from meats. ? Skimming the fat off of stews, soups, and gravies before serving them. ? Steaming vegetables in water or broth.  Lose weight if you are overweight.  Eat 4-5 servings of nuts, legumes, and seeds per week: ? One serving of dried beans or legumes equals  cup after being cooked. ? One serving of nuts equals 1 ounces. ? One serving of seeds equals  ounce or one tablespoon.  You may need to keep track of how much salt or sodium you eat. This is especially true if you have high blood pressure. Talk with your doctor or dietitian to get more information. What foods can I eat? Grains Breads, including French, white, pita, wheat, raisin, rye, oatmeal, and Italian. Tortillas that are neither fried nor made with lard or trans fat. Low-fat rolls, including hotdog and hamburger buns and English muffins. Biscuits. Muffins. Waffles. Pancakes. Light popcorn. Whole-grain cereals. Flatbread. Melba toast. Pretzels. Breadsticks. Rusks. Low-fat snacks. Low-fat crackers, including oyster, saltine, matzo, graham, animal, and rye. Rice and pasta, including brown rice and pastas that are made with whole wheat. Vegetables All vegetables. Fruits All fruits, but limit coconut. Meats and Other Protein Sources Lean, well-trimmed beef, veal, pork, and lamb. Chicken and turkey without skin. All fish and shellfish.   Wild duck, rabbit, pheasant, and venison. Egg whites or low-cholesterol egg substitutes. Dried beans, peas, lentils, and tofu. Seeds and most nuts. Dairy Low-fat or nonfat cheeses, including ricotta, string, and mozzarella. Skim or 1% milk that is liquid, powdered, or evaporated. Buttermilk that is made with low-fat milk. Nonfat or low-fat  yogurt. Beverages Mineral water. Diet carbonated beverages. Sweets and Desserts Sherbets and fruit ices. Honey, jam, marmalade, jelly, and syrups. Meringues and gelatins. Pure sugar candy, such as hard candy, jelly beans, gumdrops, mints, marshmallows, and small amounts of dark chocolate. [employer] Eat all sweets and desserts in moderation. Fats and Oils Nonhydrogenated (trans-free) margarines. Vegetable oils, including soybean, sesame, sunflower, olive, peanut, safflower, corn, canola, and cottonseed. Salad dressings or mayonnaise made with a vegetable oil. Limit added fats and oils that you use for cooking, baking, salads, and as spreads. Other Cocoa powder. Coffee and tea. All seasonings and condiments. The items listed above may not be a complete list of recommended foods or beverages. Contact your dietitian for more options. What foods are not recommended? Grains Breads that are made with saturated or trans fats, oils, or whole milk. Croissants. Butter rolls. Cheese breads. Sweet rolls. Donuts. Buttered popcorn. Chow mein noodles. High-fat crackers, such as cheese or butter crackers. Meats and Other Protein Sources Fatty meats, such as hotdogs, short ribs, sausage, spareribs, bacon, rib eye roast or steak, and mutton. High-fat deli meats, such as salami and bologna. Caviar. Domestic duck and goose. Organ meats, such as kidney, liver, sweetbreads, and heart. Dairy Cream, sour cream, cream cheese, and creamed cottage cheese. Whole-milk cheeses, including blue (bleu), Monte[place] Jack, [place], [place], American, [place], Swiss, cheddar, [place], and [place]. Whole or 2% milk that is liquid, evaporated, or condensed. Whole buttermilk. Cream sauce or high-fat cheese sauce. Yogurt that is made from whole milk. Beverages Regular sodas and juice drinks with added sugar. Sweets and Desserts Frosting. Pudding. Cookies. Cakes other than angel food cake. Candy that has milk chocolate or white  chocolate, hydrogenated fat, butter, coconut, or unknown ingredients. Buttered syrups. Full-fat ice cream or ice cream drinks. Fats and Oils Gravy that has suet, meat fat, or shortening. Cocoa butter, hydrogenated oils, palm oil, coconut oil, palm kernel oil. These can often be found in baked products, candy, fried foods, nondairy creamers, and whipped toppings. Solid fats and shortenings, including bacon fat, salt pork, lard, and butter. Nondairy cream substitutes, such as coffee creamers and sour cream substitutes. Salad dressings that are made of unknown oils, cheese, or sour cream. The items listed above may not be a complete list of foods and beverages to avoid. Contact your dietitian for more information. This information is not intended to replace advice given to you by your health care provider. Make sure you discuss any questions you have with your health care provider. Document Released: 08/07/2011 Document Revised: 07/14/2015 Document Reviewed: 07/30/2013 Elsevier Interactive Patient Education  2018 [employer].   Exercising to [employer] Exercising can help you to lose weight. In order to lose weight through exercise, you need to do vigorous-intensity exercise. You can tell that you are exercising with vigorous intensity if you are breathing very hard and fast and cannot hold a conversation while exercising. Moderate-intensity exercise helps to maintain your current weight. You can tell that you are exercising at a moderate level if you have a higher heart rate and faster breathing, but you are still able to hold a conversation. How often should I exercise? Choose an activity that you enjoy  and set realistic goals. Your health care provider can help you to make an activity plan that works for you. Exercise regularly as directed by your health care provider. This may include:  Doing resistance training twice each week, such as: ? Push-ups. ? Sit-ups. ? Lifting weights. ? Using  resistance bands.  Doing a given intensity of exercise for a given amount of time. Choose from these options: ? 150 minutes of moderate-intensity exercise every week. ? 75 minutes of vigorous-intensity exercise every week. ? A mix of moderate-intensity and vigorous-intensity exercise every week.  Children, pregnant women, people who are out of shape, people who are overweight, and older adults may need to consult a health care provider for individual recommendations. If you have any sort of medical condition, be sure to consult your health care provider before starting a new exercise program. What are some activities that can help me to lose weight?  Walking at a rate of at least 4.5 miles an hour.  Jogging or running at a rate of 5 miles per hour.  Biking at a rate of at least 10 miles per hour.  Lap swimming.  Roller-skating or in-line skating.  Cross-country skiing.  Vigorous competitive sports, such as football, basketball, and soccer.  Jumping rope.  Aerobic dancing. How can I be more active in my day-to-day activities?  Use the stairs instead of the elevator.  Take a walk during your lunch break.  If you drive, park your car farther away from work or school.  If you take public transportation, get off one stop early and walk the rest of the way.  Make all of your phone calls while standing up and walking around.  Get up, stretch, and walk around every 30 minutes throughout the day. What guidelines should I follow while exercising?  Do not exercise so much that you hurt yourself, feel dizzy, or get very short of breath.  Consult your health care provider prior to starting a new exercise program.  Wear comfortable clothes and shoes with good support.  Drink plenty of water while you exercise to prevent dehydration or heat stroke. Body water is lost during exercise and must be replaced.  Work out until you breathe faster and your heart beats faster. This  information is not intended to replace advice given to you by your health care provider. Make sure you discuss any questions you have with your health care provider. Document Released: 03/10/2010 Document Revised: 07/14/2015 Document Reviewed: 07/09/2013 Elsevier Interactive Patient Education  2018 [employer].  Increase water intake, strive for at least 100 ounces/day.   Follow Heart Healthy diet Increase regular exercise.  Recommend at least 30 minutes daily, 5 days per week of walking, jogging, biking, swimming, YouTube/Pinterest workout videos. Increase intensity/duration of work-outs as tolerated. Continue all medications as directed. Once you get new health insurance, please call clinic and we will send in referral to nutritionist. Follow-up in 4 months, sooner If needed. NICE TO SEE YOU!

## 2017-05-06 NOTE — Assessment & Plan Note (Signed)
BP above goal 154/90, HR 74 He takes Lisinopril 10mg  each day at 1100- he has not taken daily dose yet If BP remains above goal after rx at next OV will increase dosage.

## 2017-05-06 NOTE — Assessment & Plan Note (Signed)
.  Body mass index is 38.65 kg/m.  Once new health ins secured, please call and we will put in referral to nutritionist

## 2017-05-06 NOTE — Assessment & Plan Note (Signed)
Increase water intake, strive for at least 100 ounces/day.   Follow Heart Healthy diet Increase regular exercise.  Recommend at least 30 minutes daily, 5 days per week of walking, jogging, biking, swimming, YouTube/Pinterest workout videos. Increase intensity/duration of work-outs as tolerated. Continue all medications as directed. Once you get new health insurance, please call clinic and we will send in referral to nutritionist. Follow-up in 4 months, sooner If needed.

## 2017-05-07 LAB — VITAMIN D 25 HYDROXY (VIT D DEFICIENCY, FRACTURES): VIT D 25 HYDROXY: 33.2 ng/mL (ref 30.0–100.0)

## 2017-07-20 ENCOUNTER — Other Ambulatory Visit: Payer: Self-pay | Admitting: Adult Health

## 2017-09-05 ENCOUNTER — Ambulatory Visit: Payer: Self-pay | Admitting: Adult Health

## 2017-09-27 IMAGING — DX DG LUMBAR SPINE COMPLETE 4+V
5 series · 5 of 5 positions shown · non-contrast
Comparison: None.

CLINICAL DATA: Low back pain with left radiculopathy

EXAM:
LUMBAR SPINE - COMPLETE 4+ VIEW

[l-spine ap]
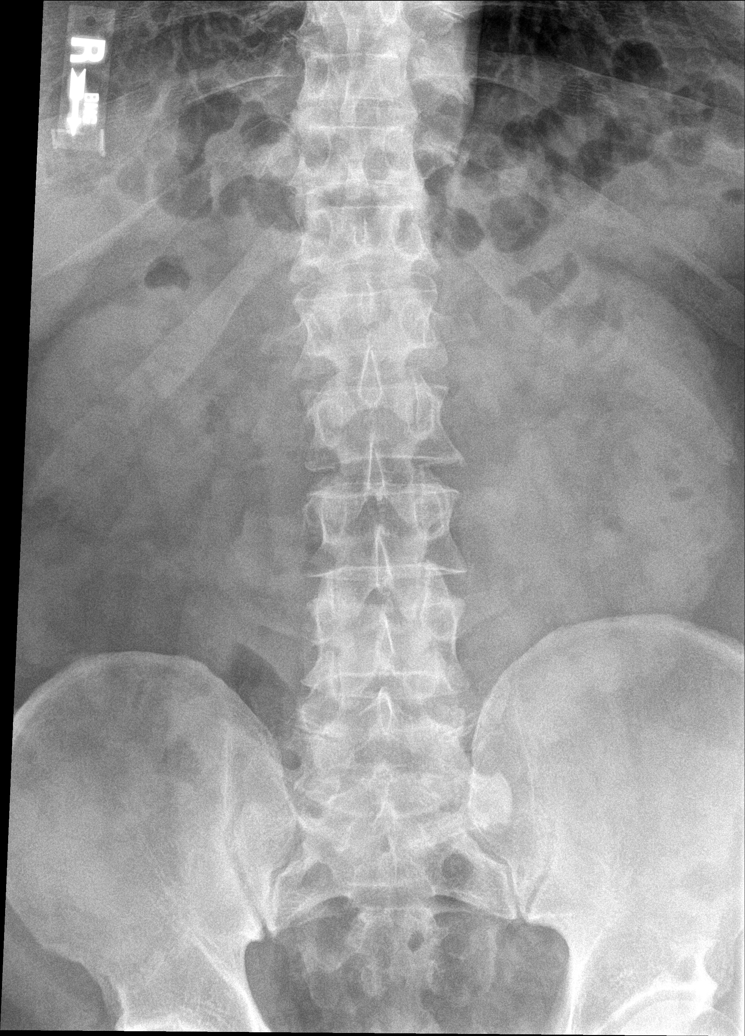

[l-spine obl (1 of 2)]
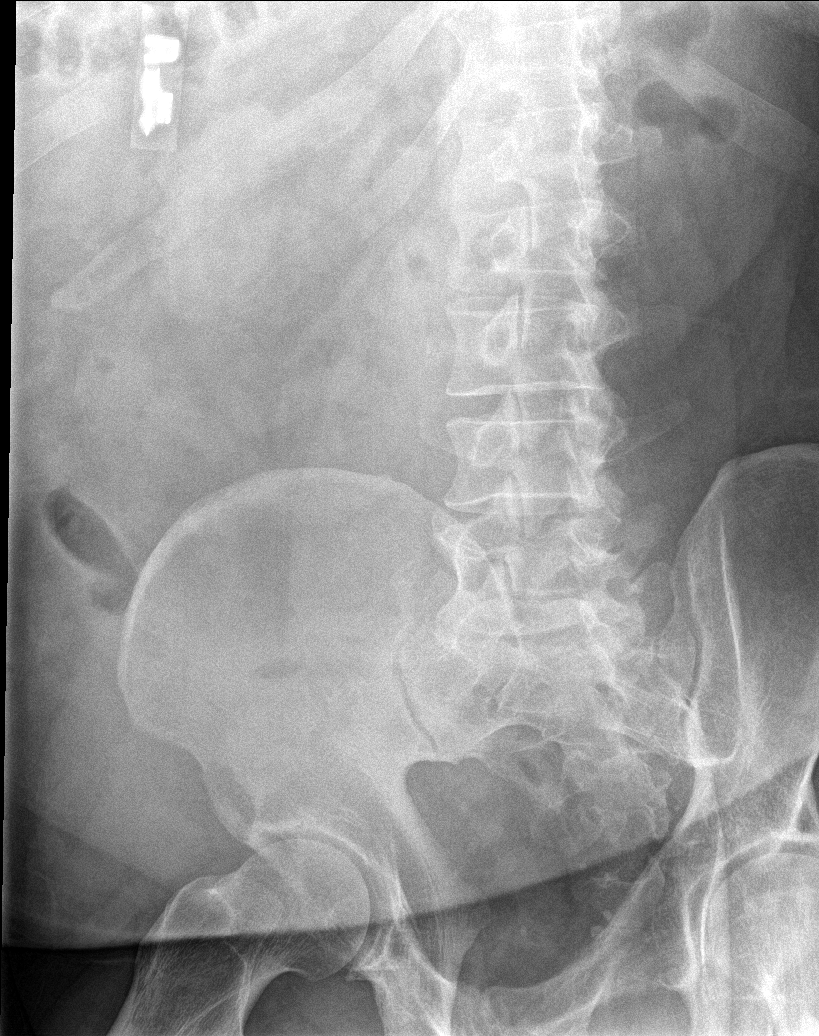

[l-spine obl (2 of 2)]
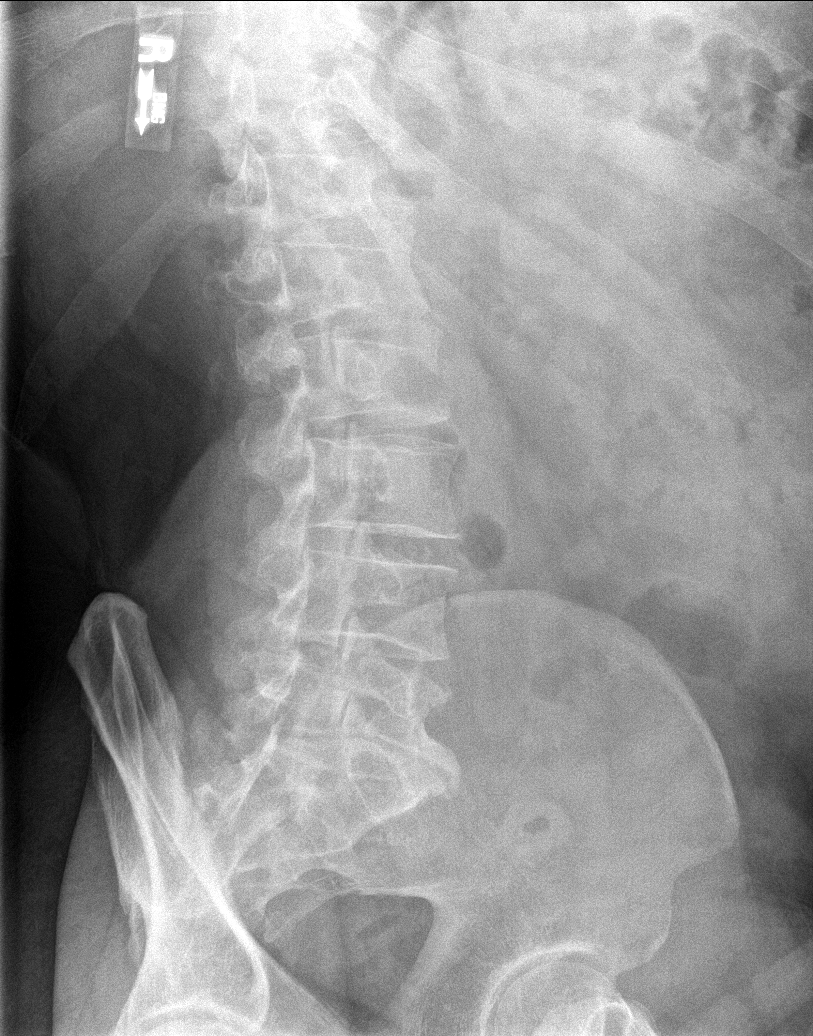

[l-spine lat]
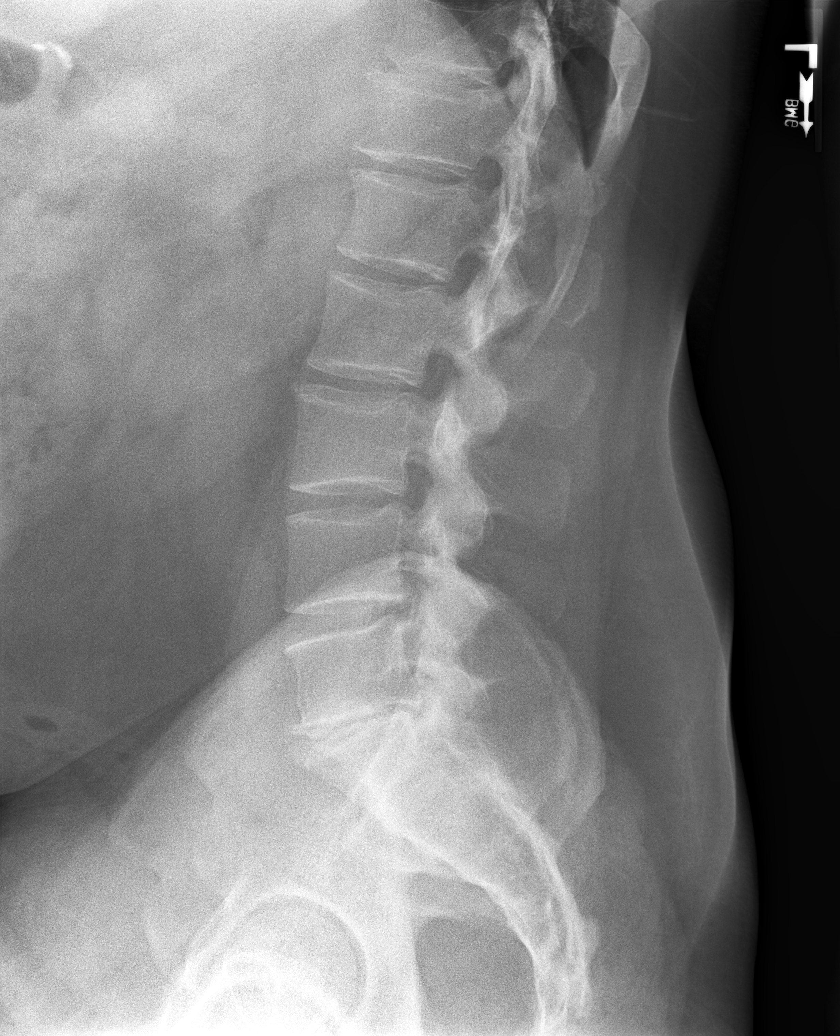

[l-spine l5-s1]
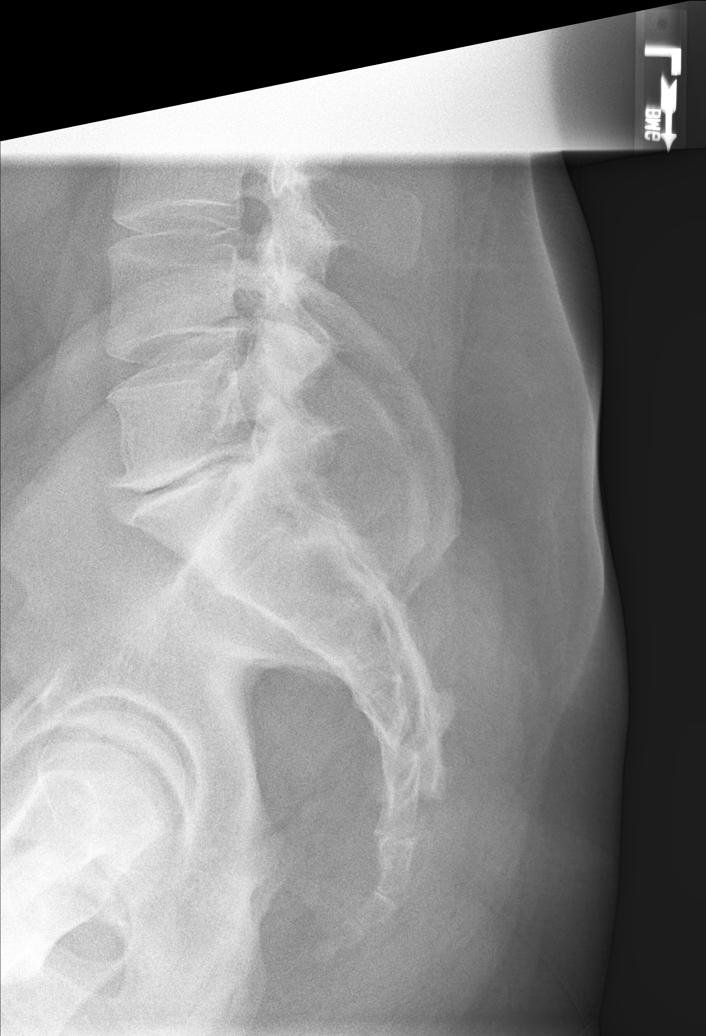

[5 of 5 positions shown; findings below may reference images not displayed]

FINDINGS: There is no evidence of lumbar spine fracture. Alignment is normal.
Degenerative disc disease with disc height loss at L5-S1 with
bilateral facet arthropathy.
IMPRESSION: Degenerative disc disease with disc height loss at L5-S1 with
bilateral facet arthropathy.

## 2017-10-23 ENCOUNTER — Other Ambulatory Visit: Payer: Self-pay | Admitting: Adult Health

## 2017-11-25 ENCOUNTER — Other Ambulatory Visit: Payer: Self-pay | Admitting: Adult Health

## 2017-11-25 ENCOUNTER — Telehealth: Payer: Self-pay | Admitting: Adult Health

## 2017-11-25 NOTE — Telephone Encounter (Signed)
Left patient message to contact office regarding provider required OV.  --forwarding notice to medical assistance.  --glh

## 2017-12-08 NOTE — Progress Notes (Signed)
Subjective:    Patient ID: [name] [name], male    DOB: [birth year], 47 y.o.   MRN: [id number]  HPI: 10/24/2016  OV: [name] [name] presents to establish as a new pt.  He is a very pleasant 47 year old male. PMH: HTN, obesity, and productive cough that developed last week after pitching during softball practice in 100 degree heat. He has also had clear nasal drainage and "post nasal gtt".  He has used a few doses of Therflu and feels that it help resolve his acute sx's. He denies tobacco/EOTH use.  He denies also states "I think I might developing some anxiety as a get older".  He recently started reducing soda and increasing water intake and estimates to drink 40 ounces water/day.  He has been reducing saturated fat and CHO intake as well.  He has lost 20 lbs I last 12 months and hopes to loss another 75 lbs. He denies CP/dyspnea/palpipations.  11/26/2016 Note: [name] [name] is here for /f/u HTN.  At initial OV BP was well above goal, BMP drawn and started on Lisinopril 10mg .  He reports medication compliance and denies SE.  He continues to walk 72miles day and has been following a heart healthy diet (minus yesterday at family reunion buffet).  He does not check his BP at home   12/31/2016 Note: [name] [name] is here for CPE.  He reports taking Lisinopril 10mg  daily and has started once weekly vit d. He continues to walk >2 miles 5 days a week and is quite frustrated that he has not lost any additional wt since last OV. He estimates to drink 40-60 oz water/day, with afternoon sweat tea and occasional coke.  He denies tobacco/ETOH use.   He eats the same meals daily- breakfast grits, lunch salmon   Healthcare Maintenance: Colonoscopy  02/20/17 OV: [name] [name] here for f/u: HTN, wt check, and anxiety.  He is quite frustrated that he is not losing wt despite walking 2 miles every day (has been doing this >12 months). He reports medication compliance- Lisinopril and ergocalciferol-denies SE. He  reports being "really hungry all the time" and believes "that I don't eat enough".  He continues to have back and L knee pain.  He was advised to f/u with [facility] is knee pain begins to impact daily life.  05/06/17 OV: [name] [name] presents for f/u: HTN, Obesity, Anxiety.  His wt remains 275-277 He walks 2 miles/stretches everyday of week. He reports eating the "pretty much the same breakfast/dinner" daily, has not added in lunch. He reports [facility] has released him to perform "as much exercise as I can tolerate". He will be losing his insurance through his employer at end of month and would like to hold off on referral to nutritionist until new health ins. Is secured. He reports taking Lisinopril 10mg  each morning at 1100- he has not rx this am  12/11/17 OV: [name] [name] is here for regular f/u: HTN, Obesity, Anxiety He continues to walk 2 miles/day and "really tries follow heart healthy diet". He is quite frustrated that he has gained 3 lbs and his blood pressure remains abive goal despite his healthy lifestyle efforts. He also continues to struggle with anxiety, again declined CBT referral or starting medication. Due to sig HTN, oral weight loss medication is not appropriate. We discussed starting Liraglutide to augment regular exercise and healthy eating to help achieve a healthy BMI  Patient Care Team    Relationship Specialty Notifications Start End  Esaw Grandchild, NP PCP - General Family Medicine  10/24/16     Patient Active Problem List   Diagnosis Date Noted  . Morbid obesity ([place]) 02/20/2017  . Anxiety about health 12/31/2016  . Hypertension 10/24/2016  . Healthcare maintenance 10/24/2016  . Seasonal allergies 10/24/2016  . Lumbar back pain with radiculopathy affecting left lower extremity 06/05/2016  . Left knee pain 06/05/2016  . Hypokalemia 02/10/2014  . Pancreatitis 02/05/2014  . Acute pancreatitis 02/05/2014  . Cholelithiasis 02/05/2014  . Hepatic steatosis  02/05/2014  . Elevated LFTs 02/05/2014     Past Medical History:  Diagnosis Date  . Anxiety   . Arthritis    hip pain from time to time   . Hernia of abdominal cavity "born w/it"  . Kidney stones      Past Surgical History:  Procedure Laterality Date  . CHOLECYSTECTOMY N/A 02/08/2014   Procedure: ATTEMPTED LAPAROSCOPIC CHOLECYSTECTOMY;  Surgeon: [name], MD;  Location: [facility];  Service: General;  Laterality: N/A;  . CHOLECYSTECTOMY N/A 02/08/2014   rocedure: CHOLECYSTECTOMY;  Surgeon: [name], MD;  Location: [facility];  Service: General;  Laterality: N/A;  . HERNIA REPAIR    . INSERTION OF MESH N/A 09/29/2015   Procedure: INSERTION OF MESH;  Surgeon: [name], MD;  Location: [facility];  Service: General;  Laterality: N/A;  . URETEROLITHOTOMY  1989  . VENTRAL HERNIA REPAIR N/A 09/29/2015   Procedure: LAPAROSCOPIC VENTRAL HERNIA;  Surgeon: [name], MD;  Location: [facility];  Service: General;  Laterality: N/A;     Family History  Problem Relation Age of Onset  . Asthma Mother   . COPD Mother   . Cancer Father        bone  . Glaucoma Father   . Healthy Brother      Social History   Substance and Sexual Activity  Drug Use No     Social History   Substance and Sexual Activity  Alcohol Use No     Social History   Tobacco Use  Smoking Status Never Smoker  Smokeless Tobacco Never Used     Outpatient Encounter Medications as of 12/11/2017  Medication Sig  . acetaminophen (TYLENOL) 325 MG tablet Take 650 mg by mouth every 6 (six) hours as needed for mild pain.  Marland Kitchen lisinopril (PRINIVIL,ZESTRIL) 20 MG tablet Take 1 tablet (20 mg total) by mouth daily.  . Vitamin D, Ergocalciferol, (DRISDOL) 50000 units CAPS capsule Take 1 capsule (50,000 Units total) every 7 (seven) days by mouth.  . [DISCONTINUED] lisinopril (PRINIVIL,ZESTRIL) 10 MG tablet Take 1 tablet (10 mg total) by mouth daily. NO FURTHER REFILLS UNTIL PATIENT IS SEEN FOR FOLLOW UP  .  [EXPIRED] cloNIDine (CATAPRES) tablet 0.2 mg    No facility-administered encounter medications on file as of 12/11/2017.     Allergies: Prednisone  Body mass index is 39.15 kg/m.  Blood pressure 134/87, pulse 93, height 5\' 11"  (1.803 m), weight 280 lb 11.2 oz (127.3 kg), SpO2 97 %.  Review of Systems  Constitutional: Positive for fatigue. Negative for activity change, appetite change, chills, diaphoresis, fever and unexpected weight change.  HENT: Negative for congestion, postnasal drip, sneezing, trouble swallowing and voice change.   Eyes: Negative for visual disturbance.  Respiratory: Negative for cough, chest tightness, shortness of breath, wheezing and stridor.   Cardiovascular: Negative for chest pain, palpitations and leg swelling.  Gastrointestinal: Negative for abdominal distention, abdominal pain, blood in stool, constipation, diarrhea, nausea and vomiting.  Endocrine: Negative for  cold intolerance, heat intolerance, polydipsia, polyphagia and polyuria.  Genitourinary: Negative for difficulty urinating and flank pain.  Musculoskeletal: Positive for arthralgias.       L knee pain r/t injury in April 2018  Skin: Negative for color change, pallor, rash and wound.  Neurological: Negative for dizziness, tremors, weakness and headaches.  Hematological: Does not bruise/bleed easily.  Psychiatric/Behavioral: Negative for decreased concentration, self-injury, sleep disturbance and suicidal ideas. The patient is nervous/anxious.        Objective:   Physical Exam  Constitutional: He is oriented to person, place, and time. He appears well-developed and well-nourished. No distress.  HENT:  Head: Normocephalic and atraumatic.  Right Ear: Hearing and external ear normal. No decreased hearing is noted.  Left Ear: Hearing, external ear and ear canal normal. No decreased hearing is noted.  Nose: Mucosal edema and rhinorrhea present. Right sinus exhibits no maxillary sinus tenderness and  no frontal sinus tenderness. Left sinus exhibits no maxillary sinus tenderness and no frontal sinus tenderness.  Mouth/Throat: Uvula is midline and mucous membranes are normal. No oropharyngeal exudate, posterior oropharyngeal edema or posterior oropharyngeal erythema.  Eyes: Pupils are equal, round, and reactive to light. Conjunctivae are normal.  Cardiovascular: Normal rate, regular rhythm, normal heart sounds and intact distal pulses.  No murmur heard. Pulmonary/Chest: Effort normal and breath sounds normal. No respiratory distress. He has no wheezes. He has no rales. He exhibits no tenderness.  Abdominal: He exhibits no distension and no mass. There is no tenderness. There is no rebound and no guarding.  Musculoskeletal: He exhibits no edema or tenderness.       Left knee: He exhibits normal range of motion.  Neurological: He is alert and oriented to person, place, and time.  Skin: Skin is warm and dry. No rash noted. He is not diaphoretic. No erythema. No pallor.  Psychiatric: His speech is normal and behavior is normal. Judgment and thought content normal. His mood appears anxious. Cognition and memory are normal.  Nursing note and vitals reviewed.      Assessment & Plan:   1. Hypertension, unspecified type   2. Elevated LFTs   3. Hypokalemia   4. Healthcare maintenance     Hypertension BP -167/108 Recheck 150/11 Pt given clonidine 0.2mg  tolerated well, BP recheck 134/87 Increased Lisinopril from 10mg  to 20mg  once daily, start new dosage tomorrow 12/12/17. Please check blood pressure and heart rate several times/week, bring readings to follow in 3 weeks- which will be your complete physical with fasting labs. Increase walking distance to 2.5/3 miles a day. Recommend Lose It App. If weight is not improved at follow-up we will discuss starting Saxenda.  Pt was in the office today for 45+ minutes, I spent >75% of time in face to face counseling of patient's various medical  conditions and in coordination of care  FOLLOW-UP:  Return in about 3 weeks (around 01/01/2018) for CPE, Fasting Labs.

## 2017-12-11 ENCOUNTER — Ambulatory Visit (INDEPENDENT_AMBULATORY_CARE_PROVIDER_SITE_OTHER): Payer: No Typology Code available for payment source | Admitting: Adult Health

## 2017-12-11 ENCOUNTER — Encounter: Payer: Self-pay | Admitting: Adult Health

## 2017-12-11 VITALS — BP 134/87 | HR 93 | Ht 71.0 in | Wt 280.7 lb

## 2017-12-11 DIAGNOSIS — R7989 Other specified abnormal findings of blood chemistry: Secondary | ICD-10-CM

## 2017-12-11 DIAGNOSIS — I10 Essential (primary) hypertension: Secondary | ICD-10-CM

## 2017-12-11 DIAGNOSIS — R945 Abnormal results of liver function studies: Secondary | ICD-10-CM

## 2017-12-11 DIAGNOSIS — E876 Hypokalemia: Secondary | ICD-10-CM

## 2017-12-11 DIAGNOSIS — Z0000 Encounter for general adult medical examination without abnormal findings: Secondary | ICD-10-CM

## 2017-12-11 MED ORDER — CLONIDINE HCL 0.2 MG PO TABS
0.2000 mg | ORAL_TABLET | Freq: Once | ORAL | Status: AC
  Administered 2017-12-11: 0.2 mg via ORAL

## 2017-12-11 MED ORDER — LISINOPRIL 20 MG PO TABS: 20.0000 mg | ORAL_TABLET | Freq: Every day | ORAL | 2 refills | Status: DC

## 2017-12-11 NOTE — Patient Instructions (Addendum)
Hypertension Hypertension, commonly called high blood pressure, is when the force of blood pumping through the arteries is too strong. The arteries are the blood vessels that carry blood from the heart throughout the body. Hypertension forces the heart to work harder to pump blood and may cause arteries to become narrow or stiff. Having untreated or uncontrolled hypertension can cause heart attacks, strokes, kidney disease, and other problems. A blood pressure reading consists of a higher number over a lower number. Ideally, your blood pressure should be below 120/80. The first ("top") number is called the systolic pressure. It is a measure of the pressure in your arteries as your heart beats. The second ("bottom") number is called the diastolic pressure. It is a measure of the pressure in your arteries as the heart relaxes. What are the causes? The cause of this condition is not known. What increases the risk? Some risk factors for high blood pressure are under your control. Others are not. Factors you can change  Smoking.  Having type 2 diabetes mellitus, high cholesterol, or both.  Not getting enough exercise or physical activity.  Being overweight.  Having too much fat, sugar, calories, or salt (sodium) in your diet.  Drinking too much alcohol. Factors that are difficult or impossible to change  Having chronic kidney disease.  Having a family history of high blood pressure.  Age. Risk increases with age.  Race. You may be at higher risk if you are African-American.  Gender. Men are at higher risk than women before age 45. After age 65, women are at higher risk than men.  Having obstructive sleep apnea.  Stress. What are the signs or symptoms? Extremely high blood pressure (hypertensive crisis) may cause:  Headache.  Anxiety.  Shortness of breath.  Nosebleed.  Nausea and vomiting.  Severe chest pain.  Jerky movements you cannot control (seizures).  How is this  diagnosed? This condition is diagnosed by measuring your blood pressure while you are seated, with your arm resting on a surface. The cuff of the blood pressure monitor will be placed directly against the skin of your upper arm at the level of your heart. It should be measured at least twice using the same arm. Certain conditions can cause a difference in blood pressure between your right and left arms. Certain factors can cause blood pressure readings to be lower or higher than normal (elevated) for a short period of time:  When your blood pressure is higher when you are in a health care provider's office than when you are at home, this is called white coat hypertension. Most people with this condition do not need medicines.  When your blood pressure is higher at home than when you are in a health care provider's office, this is called masked hypertension. Most people with this condition may need medicines to control blood pressure.  If you have a high blood pressure reading during one visit or you have normal blood pressure with other risk factors:  You may be asked to return on a different day to have your blood pressure checked again.  You may be asked to monitor your blood pressure at home for 1 week or longer.  If you are diagnosed with hypertension, you may have other blood or imaging tests to help your health care provider understand your overall risk for other conditions. How is this treated? This condition is treated by making healthy lifestyle changes, such as eating healthy foods, exercising more, and reducing your alcohol intake. Your   health care provider may prescribe medicine if lifestyle changes are not enough to get your blood pressure under control, and if:  Your systolic blood pressure is above 130.  Your diastolic blood pressure is above 80.  Your personal target blood pressure may vary depending on your medical conditions, your age, and other factors. Follow these  instructions at home: Eating and drinking  Eat a diet that is high in fiber and potassium, and low in sodium, added sugar, and fat. An example eating plan is called the DASH (Dietary Approaches to Stop Hypertension) diet. To eat this way: ? Eat plenty of fresh fruits and vegetables. Try to fill half of your plate at each meal with fruits and vegetables. ? Eat whole grains, such as whole wheat pasta, brown rice, or whole grain bread. Fill about one quarter of your plate with whole grains. ? Eat or drink low-fat dairy products, such as skim milk or low-fat yogurt. ? Avoid fatty cuts of meat, processed or cured meats, and poultry with skin. Fill about one quarter of your plate with lean proteins, such as fish, chicken without skin, beans, eggs, and tofu. ? Avoid premade and processed foods. These tend to be higher in sodium, added sugar, and fat.  Reduce your daily sodium intake. Most people with hypertension should eat less than 1,500 mg of sodium a day.  Limit alcohol intake to no more than 1 drink a day for nonpregnant women and 2 drinks a day for men. One drink equals 12 oz of beer, 5 oz of wine, or 1 oz of hard liquor. Lifestyle  Work with your health care provider to maintain a healthy body weight or to lose weight. Ask what an ideal weight is for you.  Get at least 30 minutes of exercise that causes your heart to beat faster (aerobic exercise) most days of the week. Activities may include walking, swimming, or biking.  Include exercise to strengthen your muscles (resistance exercise), such as pilates or lifting weights, as part of your weekly exercise routine. Try to do these types of exercises for 30 minutes at least 3 days a week.  Do not use any products that contain nicotine or tobacco, such as cigarettes and e-cigarettes. If you need help quitting, ask your health care provider.  Monitor your blood pressure at home as told by your health care provider.  Keep all follow-up visits as  told by your health care provider. This is important. Medicines  Take over-the-counter and prescription medicines only as told by your health care provider. Follow directions carefully. Blood pressure medicines must be taken as prescribed.  Do not skip doses of blood pressure medicine. Doing this puts you at risk for problems and can make the medicine less effective.  Ask your health care provider about side effects or reactions to medicines that you should watch for. Contact a health care provider if:  You think you are having a reaction to a medicine you are taking.  You have headaches that keep coming back (recurring).  You feel dizzy.  You have swelling in your ankles.  You have trouble with your vision. Get help right away if:  You develop a severe headache or confusion.  You have unusual weakness or numbness.  You feel faint.  You have severe pain in your chest or abdomen.  You vomit repeatedly.  You have trouble breathing. Summary  Hypertension is when the force of blood pumping through your arteries is too strong. If this condition is not   controlled, it may put you at risk for serious complications.  Your personal target blood pressure may vary depending on your medical conditions, your age, and other factors. For most people, a normal blood pressure is less than 120/80.  Hypertension is treated with lifestyle changes, medicines, or a combination of both. Lifestyle changes include weight loss, eating a healthy, low-sodium diet, exercising more, and limiting alcohol. This information is not intended to replace advice given to you by your health care provider. Make sure you discuss any questions you have with your health care provider. Document Released: 02/05/2005 Document Revised: 01/04/2016 Document Reviewed: 01/04/2016 Elsevier Interactive Patient Education  2018 [place] refers to food and lifestyle choices that  are based on the traditions of countries located on the [employer]. This way of eating has been shown to help prevent certain conditions and improve outcomes for people who have chronic diseases, like kidney disease and heart disease. What are tips for following this plan? Lifestyle  Cook and eat meals together with your family, when possible.  Drink enough fluid to keep your urine clear or pale yellow.  Be physically active every day. This includes: ? Aerobic exercise like running or swimming. ? Leisure activities like gardening, walking, or housework.  Get 7-8 hours of sleep each night.  If recommended by your health care provider, drink red wine in moderation. This means 1 glass a day for nonpregnant women and 2 glasses a day for men. A glass of wine equals 5 oz (150 mL). Reading food labels  Check the serving size of packaged foods. For foods such as rice and pasta, the serving size refers to the amount of cooked product, not dry.  Check the total fat in packaged foods. Avoid foods that have saturated fat or trans fats.  Check the ingredients list for added sugars, such as corn syrup. Shopping  At the grocery store, buy most of your food from the areas near the walls of the store. This includes: ? Fresh fruits and vegetables (produce). ? Grains, beans, nuts, and seeds. Some of these may be available in unpackaged forms or large amounts (in bulk). ? Fresh seafood. ? Poultry and eggs. ? Low-fat dairy products.  Buy whole ingredients instead of prepackaged foods.  Buy fresh fruits and vegetables in-season from local farmers markets.  Buy frozen fruits and vegetables in resealable bags.  If you do not have access to quality fresh seafood, buy precooked frozen shrimp or canned fish, such as tuna, salmon, or sardines.  Buy small amounts of raw or cooked vegetables, salads, or olives from the deli or salad bar at your store.  Stock your pantry so you always have certain  foods on hand, such as olive oil, canned tuna, canned tomatoes, rice, pasta, and beans. Cooking  Cook foods with extra-virgin olive oil instead of using butter or other vegetable oils.  Have meat as a side dish, and have vegetables or grains as your main dish. This means having meat in small portions or adding small amounts of meat to foods like pasta or stew.  Use beans or vegetables instead of meat in common dishes like chili or lasagna.  Experiment with different cooking methods. Try roasting or broiling vegetables instead of steaming or sauteing them.  Add frozen vegetables to soups, stews, pasta, or rice.  Add nuts or seeds for added healthy fat at each meal. You can add these to yogurt, salads, or vegetable dishes.  Marinate  fish or vegetables using olive oil, lemon juice, garlic, and fresh herbs. Meal planning  Plan to eat 1 vegetarian meal one day each week. Try to work up to 2 vegetarian meals, if possible.  Eat seafood 2 or more times a week.  Have healthy snacks readily available, such as: ? Vegetable sticks with hummus. ? [place] yogurt. ? Fruit and nut trail mix.  Eat balanced meals throughout the week. This includes: ? Fruit: 2-3 servings a day ? Vegetables: 4-5 servings a day ? Low-fat dairy: 2 servings a day ? Fish, poultry, or lean meat: 1 serving a day ? Beans and legumes: 2 or more servings a week ? Nuts and seeds: 1-2 servings a day ? Whole grains: 6-8 servings a day ? Extra-virgin olive oil: 3-4 servings a day  Limit red meat and sweets to only a few servings a month What are my food choices?  Mediterranean diet ? Recommended ? Grains: Whole-grain pasta. Brown rice. Bulgar wheat. Polenta. Couscous. Whole-wheat bread. [place]. ? Vegetables: Artichokes. Beets. Broccoli. Cabbage. Carrots. Eggplant. Green beans. Chard. Kale. Spinach. Onions. Leeks. Peas. Squash. Tomatoes. Peppers. Radishes. ? Fruits: Apples. Apricots. Avocado. Berries. Bananas.  [place]. Dates. Figs. Grapes. Lemons. Melon. Oranges. Peaches. Plums. Pomegranate. ? Meats and other protein foods: Beans. Almonds. Sunflower seeds. Pine nuts. Peanuts. [place]. Salmon. Scall[place]. Shrimp. [place]. Tilapia. Clams. Oysters. Eggs. ? Dairy: Low-fat milk. Cheese. Greek yogurt. ? Beverages: Water. Red wine. Herbal tea. ? Fats and oils: Extra virgin olive oil. Avocado oil. Grape seed oil. ? Sweets and desserts: [place] yogurt with honey. Baked apples. Poached pears. Trail mix. ? Seasoning and other foods: Basil. Cilantro. Coriander. Cumin. Mint. Parsley. Sage. Rosemary. Tarragon. Garlic. Oregano. Thyme. Pepper. Balsalmic vinegar. Tahini. Hummus. Tomato sauce. Olives. Mushrooms. ? Limit these ? Grains: Prepackaged pasta or rice dishes. Prepackaged cereal with added sugar. ? Vegetables: Deep fried potatoes (french fries). ? Fruits: Fruit canned in syrup. ? Meats and other protein foods: Beef. Pork. Lamb. Poultry with skin. Hot dogs. [place] Salines. ? Dairy: Ice cream. Sour cream. Whole milk. ? Beverages: Juice. Sugar-sweetened soft drinks. Beer. Liquor and spirits. ? Fats and oils: Butter. Canola oil. Vegetable oil. Beef fat (tallow). Lard. ? Sweets and desserts: Cookies. Cakes. Pies. Candy. ? Seasoning and other foods: Mayonnaise. Premade sauces and marinades. ? The items listed may not be a complete list. Talk with your dietitian about what dietary choices are right for you. Summary  The Mediterranean diet includes both food and lifestyle choices.  Eat a variety of fresh fruits and vegetables, beans, nuts, seeds, and whole grains.  Limit the amount of red meat and sweets that you eat.  Talk with your health care provider about whether it is safe for you to drink red wine in moderation. This means 1 glass a day for nonpregnant women and 2 glasses a day for men. A glass of wine equals 5 oz (150 mL). This information is not intended to replace advice given to you by your health care provider. Make  sure you discuss any questions you have with your health care provider. Document Released: 09/29/2015 Document Revised: 11/01/2015 Document Reviewed: 09/29/2015 Elsevier Interactive Patient Education  [employer].  Since both blood pressure readings were well above goal we gave you Clonidine in office, your re-check 134/87 Increased Lisinopril from 10mg  to 20mg  once daily, start new dosage tomorrow 12/12/17. Please check blood pressure and heart rate several times/week, bring readings to follow in 3 weeks- which will be your complete physical with  fasting labs. Increase walking distance to 2.5/3 miles a day. Recommend Lose It App. If weight is not improved at follow-up we will discuss starting Saxenda. NICE TO SEE YOU!

## 2017-12-11 NOTE — Assessment & Plan Note (Addendum)
BP -167/108 Recheck 150/11 Pt given clonidine 0.2mg  tolerated well, BP recheck 134/87 Increased Lisinopril from 10mg  to 20mg  once daily, start new dosage tomorrow 12/12/17. Please check blood pressure and heart rate several times/week, bring readings to follow in 3 weeks- which will be your complete physical with fasting labs. Increase walking distance to 2.5/3 miles a day. Recommend Lose It App. If weight is not improved at follow-up we will discuss starting Saxenda.

## 2017-12-25 ENCOUNTER — Other Ambulatory Visit (INDEPENDENT_AMBULATORY_CARE_PROVIDER_SITE_OTHER): Payer: No Typology Code available for payment source

## 2017-12-25 DIAGNOSIS — E876 Hypokalemia: Secondary | ICD-10-CM

## 2017-12-25 DIAGNOSIS — Z0000 Encounter for general adult medical examination without abnormal findings: Secondary | ICD-10-CM

## 2017-12-25 DIAGNOSIS — R945 Abnormal results of liver function studies: Secondary | ICD-10-CM

## 2017-12-25 DIAGNOSIS — I10 Essential (primary) hypertension: Secondary | ICD-10-CM

## 2017-12-25 DIAGNOSIS — R7989 Other specified abnormal findings of blood chemistry: Secondary | ICD-10-CM

## 2017-12-26 LAB — CBC WITH DIFFERENTIAL/PLATELET
Basophils Absolute: 0.1 10*3/uL (ref 0.0–0.2)
Basos: 1 %
EOS (ABSOLUTE): 0.2 10*3/uL (ref 0.0–0.4)
EOS: 3 %
HEMATOCRIT: 43.7 % (ref 37.5–51.0)
Hemoglobin: 15.4 g/dL (ref 13.0–17.7)
Immature Grans (Abs): 0 10*3/uL (ref 0.0–0.1)
Immature Granulocytes: 0 %
LYMPHS ABS: 2.3 10*3/uL (ref 0.7–3.1)
Lymphs: 37 %
MCH: 30 pg (ref 26.6–33.0)
MCHC: 35.2 g/dL (ref 31.5–35.7)
MCV: 85 fL (ref 79–97)
MONOS ABS: 0.5 10*3/uL (ref 0.1–0.9)
Monocytes: 9 %
Neutrophils Absolute: 3.1 10*3/uL (ref 1.4–7.0)
Neutrophils: 50 %
Platelets: 278 10*3/uL (ref 150–450)
RBC: 5.13 x10E6/uL (ref 4.14–5.80)
RDW: 12.7 % (ref 12.3–15.4)
WBC: 6.1 10*3/uL (ref 3.4–10.8)

## 2017-12-26 LAB — HEMOGLOBIN A1C
Est. average glucose Bld gHb Est-mCnc: 103 mg/dL
HEMOGLOBIN A1C: 5.2 % (ref 4.8–5.6)

## 2017-12-26 LAB — COMPREHENSIVE METABOLIC PANEL
A/G RATIO: 1.6 (ref 1.2–2.2)
ALK PHOS: 77 IU/L (ref 39–117)
ALT: 30 IU/L (ref 0–44)
AST: 24 IU/L (ref 0–40)
Albumin: 4.4 g/dL (ref 3.5–5.5)
BILIRUBIN TOTAL: 0.6 mg/dL (ref 0.0–1.2)
BUN / CREAT RATIO: 9 (ref 9–20)
BUN: 11 mg/dL (ref 6–24)
CO2: 24 mmol/L (ref 20–29)
CREATININE: 1.21 mg/dL (ref 0.76–1.27)
Calcium: 9.4 mg/dL (ref 8.7–10.2)
Chloride: 103 mmol/L (ref 96–106)
GFR calc Af Amer: 81 mL/min/{1.73_m2} (ref >59)
GFR calc non Af Amer: 70 mL/min/{1.73_m2} (ref >59)
GLOBULIN, TOTAL: 2.7 g/dL (ref 1.5–4.5)
Glucose: 98 mg/dL (ref 65–99)
POTASSIUM: 4.6 mmol/L (ref 3.5–5.2)
SODIUM: 142 mmol/L (ref 134–144)
Total Protein: 7.1 g/dL (ref 6.0–8.5)

## 2017-12-26 LAB — LIPID PANEL
CHOL/HDL RATIO: 4.6 ratio (ref 0.0–5.0)
Cholesterol, Total: 173 mg/dL (ref 100–199)
HDL: 38 mg/dL — AB (ref >39)
LDL CALC: 109 mg/dL — AB (ref 0–99)
Triglycerides: 132 mg/dL (ref 0–149)
VLDL CHOLESTEROL CAL: 26 mg/dL (ref 5–40)

## 2017-12-26 LAB — TSH: TSH: 2.22 u[IU]/mL (ref 0.450–4.500)

## 2017-12-31 NOTE — Progress Notes (Signed)
Subjective:    Patient ID: [name] [name], male    DOB: 05/15/1970, 47 y.o.   MRN: [id number]  HPI"  [name] [name] is here for CPE He has increased daily walking to >2.93miles/day and adding in daily body weight exercises- planks, sit-ups, push ups He has increased water intake and eliminated soda and sweet tea He has lost 4 lbs in 4 weeks, current wt 276 He denies CP/dyspnea/dizziness/HA/palpitations Reviewed recent labs- LDL 109 HDL 38 A1c 5.2  Healthcare Maintenance: Colonoscopy-N/A Immunizations-UTD  Patient Care Team    Relationship Specialty Notifications Start End  Mackinley Kiehn, Berna Spare, NP PCP - General Family Medicine  10/24/16     Patient Active Problem List   Diagnosis Date Noted  . Atypical mole 01/01/2018  . BMI 38.0-38.9,adult 01/01/2018  . Morbid obesity ([place]) 02/20/2017  . Anxiety about health 12/31/2016  . Hypertension 10/24/2016  . Healthcare maintenance 10/24/2016  . Seasonal allergies 10/24/2016  . Lumbar back pain with radiculopathy affecting left lower extremity 06/05/2016  . Left knee pain 06/05/2016  . Hypokalemia 02/10/2014  . Pancreatitis 02/05/2014  . Acute pancreatitis 02/05/2014  . Cholelithiasis 02/05/2014  . Hepatic steatosis 02/05/2014  . Elevated LFTs 02/05/2014     Past Medical History:  Diagnosis Date  . Anxiety   . Arthritis    hip pain from time to time   . Hernia of abdominal cavity "born w/it"  . Kidney stones      Past Surgical History:  Procedure Laterality Date  . CHOLECYSTECTOMY N/A 02/08/2014   Procedure: ATTEMPTED LAPAROSCOPIC CHOLECYSTECTOMY;  Surgeon: [name], MD;  Location: [place];  Service: General;  Laterality: N/A;  . CHOLECYSTECTOMY N/A 02/08/2014   rocedure: CHOLECYSTECTOMY;  Surgeon: [name], MD;  Location: [place];  Service: General;  Laterality: N/A;  . HERNIA REPAIR    . INSERTION OF MESH N/A 09/29/2015   Procedure: INSERTION OF MESH;  Surgeon: [name], MD;  Location: [place];  Service:  General;  Laterality: N/A;  . URETEROLITHOTOMY  1989  . VENTRAL HERNIA REPAIR N/A 09/29/2015   Procedure: LAPAROSCOPIC VENTRAL HERNIA;  Surgeon: [name], MD;  Location: [facility];  Service: General;  Laterality: N/A;     Family History  Problem Relation Age of Onset  . Asthma Mother   . COPD Mother   . Cancer Father        bone  . Glaucoma Father   . Healthy Brother      Social History   Substance and Sexual Activity  Drug Use No     Social History   Substance and Sexual Activity  Alcohol Use No     Social History   Tobacco Use  Smoking Status Never Smoker  Smokeless Tobacco Never Used     Outpatient Encounter Medications as of 01/01/2018  Medication Sig  . acetaminophen (TYLENOL) 325 MG tablet Take 650 mg by mouth every 6 (six) hours as needed for mild pain.  . Cholecalciferol (VITAMIN D3) 50 MCG (2000 UT) capsule Take 2,000 Units by mouth daily.  Marland Kitchen lisinopril (PRINIVIL,ZESTRIL) 20 MG tablet Take 1 tablet (20 mg total) by mouth daily.  . [DISCONTINUED] Vitamin D, Ergocalciferol, (DRISDOL) 50000 units CAPS capsule Take 1 capsule (50,000 Units total) every 7 (seven) days by mouth.   No facility-administered encounter medications on file as of 01/01/2018.     Allergies: Prednisone  Body mass index is 38.58 kg/m.  Blood pressure (!) 153/94, pulse 81, temperature 98.4 F (36.9 C), temperature source Oral, height  5\' 11"  (1.803 m), weight 276 lb 9.6 oz (125.5 kg), SpO2 96 %.  Review of Systems  Constitutional: Positive for fatigue. Negative for activity change, appetite change, chills, diaphoresis, fever and unexpected weight change.  HENT: Negative for congestion.        Excessive ear cerumen  Eyes: Negative for visual disturbance.  Respiratory: Negative for cough, chest tightness, shortness of breath, wheezing and stridor.   Cardiovascular: Negative for chest pain, palpitations and leg swelling.  Gastrointestinal: Negative for abdominal distention, anal  bleeding, blood in stool, constipation, diarrhea, nausea and vomiting.  Genitourinary: Negative for difficulty urinating and flank pain.  Musculoskeletal: Positive for arthralgias. Negative for back pain, gait problem, joint swelling, myalgias, neck pain and neck stiffness.  Skin: Negative for color change, pallor, rash and wound.  Neurological: Negative for dizziness and headaches.  Hematological: Does not bruise/bleed easily.  Psychiatric/Behavioral: Negative for agitation, behavioral problems, confusion, decreased concentration, dysphoric mood, hallucinations, self-injury, sleep disturbance and suicidal ideas. The patient is nervous/anxious. The patient is not hyperactive.        Objective:   Physical Exam  Constitutional: He appears well-developed and well-nourished. No distress.  HENT:  Head: Normocephalic and atraumatic.  Right Ear: External ear normal. No decreased hearing is noted.  Left Ear: External ear normal. No decreased hearing is noted.  Nose: Nose normal. No mucosal edema or rhinorrhea. Right sinus exhibits no maxillary sinus tenderness and no frontal sinus tenderness. Left sinus exhibits no maxillary sinus tenderness and no frontal sinus tenderness.  Mouth/Throat: Uvula is midline, oropharynx is clear and moist and mucous membranes are normal. No posterior oropharyngeal edema or posterior oropharyngeal erythema. Tonsils are 0 on the right. Tonsils are 0 on the left. No tonsillar exudate.  Excessive bil ear cerumen noted   Eyes: Pupils are equal, round, and reactive to light. Conjunctivae and EOM are normal.  Neck: Normal range of motion. Neck supple.  Cardiovascular: Normal rate, regular rhythm, normal heart sounds and intact distal pulses.  No murmur heard. Pulmonary/Chest: Effort normal and breath sounds normal. No stridor. No respiratory distress. He has no wheezes. He has no rales. He exhibits no tenderness.  Abdominal: Soft. Bowel sounds are normal. He exhibits no  distension and no mass. There is no tenderness. There is no rebound and no guarding. No hernia.  Protuberant abdomen  Genitourinary:  Genitourinary Comments: Declined genital/rectal examination   Musculoskeletal: Normal range of motion. He exhibits no edema or tenderness.  Lymphadenopathy:    He has no cervical adenopathy.  Neurological: He is alert. Coordination normal.  Skin: Skin is warm and dry. Capillary refill takes less than 2 seconds. No rash noted. He is not diaphoretic. No erythema. No pallor.  Flesh colored cyst noted on L clavicle   Psychiatric: His behavior is normal. Judgment and thought content normal. His mood appears anxious. His speech is rapid and/or pressured. Cognition and memory are normal.  Nursing note and vitals reviewed.      Assessment & Plan:   1. Atypical mole   2. Hypertension, unspecified type   3. Healthcare maintenance   4. Elevated LFTs   5. BMI 38.0-38.9,adult     Healthcare maintenance Continue medications as directed. Only minor elevation in LDL (bad) cholesterol and decrease in HDL (good), follow heart healthy diet and continue to increase regular exercise. Great job on [employer] lose! Follow-up in 6 months.  Hypertension BP slightly above goal- 153/94, HR 81 Lisinopril recently increased from 10mg  to 20mg , continue on current dosage Pt  reports anxiety r/t CPE today Continue to increase regular exercise and follow heart healthy diet  Elevated LFTs Current LFTs normal  BMI 38.0-38.9,adult Body mass index is 38.58 kg/m.  Current wt 276    FOLLOW-UP:  Return in about 6 months (around 07/02/2018) for Regular Follow Up.

## 2018-01-01 ENCOUNTER — Ambulatory Visit (INDEPENDENT_AMBULATORY_CARE_PROVIDER_SITE_OTHER): Payer: No Typology Code available for payment source | Admitting: Adult Health

## 2018-01-01 ENCOUNTER — Encounter: Payer: Self-pay | Admitting: Adult Health

## 2018-01-01 VITALS — BP 153/94 | HR 81 | Temp 98.4°F | Ht 71.0 in | Wt 276.6 lb

## 2018-01-01 DIAGNOSIS — Z6838 Body mass index (BMI) 38.0-38.9, adult: Secondary | ICD-10-CM

## 2018-01-01 DIAGNOSIS — I10 Essential (primary) hypertension: Secondary | ICD-10-CM | POA: Diagnosis not present

## 2018-01-01 DIAGNOSIS — Z6837 Body mass index (BMI) 37.0-37.9, adult: Secondary | ICD-10-CM | POA: Insufficient documentation

## 2018-01-01 DIAGNOSIS — Z0000 Encounter for general adult medical examination without abnormal findings: Secondary | ICD-10-CM

## 2018-01-01 DIAGNOSIS — R945 Abnormal results of liver function studies: Secondary | ICD-10-CM | POA: Diagnosis not present

## 2018-01-01 DIAGNOSIS — D229 Melanocytic nevi, unspecified: Secondary | ICD-10-CM | POA: Diagnosis not present

## 2018-01-01 DIAGNOSIS — R7989 Other specified abnormal findings of blood chemistry: Secondary | ICD-10-CM

## 2018-01-01 NOTE — Assessment & Plan Note (Signed)
BP slightly above goal- 153/94, HR 81 Lisinopril recently increased from 10mg  to 20mg , continue on current dosage Pt reports anxiety r/t CPE today Continue to increase regular exercise and follow heart healthy diet

## 2018-01-01 NOTE — Assessment & Plan Note (Signed)
Current LFTs normal

## 2018-01-01 NOTE — Patient Instructions (Addendum)

## 2018-01-01 NOTE — Assessment & Plan Note (Signed)
Body mass index is 38.58 kg/m.  Current wt 276

## 2018-01-01 NOTE — Assessment & Plan Note (Signed)
Continue medications as directed. Only minor elevation in LDL (bad) cholesterol and decrease in HDL (good), follow heart healthy diet and continue to increase regular exercise. Great job on [employer] lose! Follow-up in 6 months.

## 2018-01-17 DIAGNOSIS — Z0271 Encounter for disability determination: Secondary | ICD-10-CM

## 2018-03-12 ENCOUNTER — Ambulatory Visit: Payer: No Typology Code available for payment source | Admitting: Family Medicine

## 2018-07-02 ENCOUNTER — Ambulatory Visit: Payer: No Typology Code available for payment source | Admitting: Adult Health

## 2018-09-05 ENCOUNTER — Other Ambulatory Visit: Payer: Self-pay | Admitting: Adult Health

## 2018-10-03 ENCOUNTER — Other Ambulatory Visit: Payer: Self-pay | Admitting: Adult Health

## 2018-10-23 ENCOUNTER — Encounter: Payer: Self-pay | Admitting: Adult Health

## 2018-10-23 ENCOUNTER — Ambulatory Visit (INDEPENDENT_AMBULATORY_CARE_PROVIDER_SITE_OTHER): Payer: No Typology Code available for payment source | Admitting: Adult Health

## 2018-10-23 ENCOUNTER — Other Ambulatory Visit: Payer: Self-pay

## 2018-10-23 DIAGNOSIS — D229 Melanocytic nevi, unspecified: Secondary | ICD-10-CM | POA: Diagnosis not present

## 2018-10-23 DIAGNOSIS — I10 Essential (primary) hypertension: Secondary | ICD-10-CM | POA: Diagnosis not present

## 2018-10-23 MED ORDER — LISINOPRIL 20 MG PO TABS: ORAL_TABLET | ORAL | 0 refills | Status: DC

## 2018-10-23 NOTE — Progress Notes (Signed)
Virtual Visit via Telephone Note  I connected with [name] [name] on 10/23/18 at  4:15 PM EDT by telephone and verified that I am speaking with the correct person using two identifiers.  Location: Patient: Home Provider: In Clinic   I discussed the limitations, risks, security and privacy concerns of performing an evaluation and management service by telephone and the availability of in person appointments. I also discussed with the patient that there may be a patient responsible charge related to this service. The patient expressed understanding and agreed to proceed.   History of Present Illness: 10/24/2016  OV: Mr. [name] [name] presents to establish as a new pt.  He is a very pleasant 48 year old male. PMH: HTN, obesity, and productive cough that developed last week after pitching during softball practice in 100 degree heat. He has also had clear nasal drainage and "post nasal gtt".  He has used a few doses of Therflu and feels that it help resolve his acute sx's. He denies tobacco/EOTH use.  He denies also states "I think I might developing some anxiety as a get older".  He recently started reducing soda and increasing water intake and estimates to drink 40 ounces water/day.  He has been reducing saturated fat and CHO intake as well.  He has lost 20 lbs I last 12 months and hopes to loss another 75 lbs. He denies CP/dyspnea/palpipations.  11/26/2016 Note: Mr. [name] [name] is here for /f/u HTN.  At initial OV BP was well above goal, BMP drawn and started on Lisinopril 10mg .  He reports medication compliance and denies SE.  He continues to walk 63miles day and has been following a heart healthy diet (minus yesterday at family reunion buffet).  He does not check his BP at home   12/31/2016 Note: Mr. [name] [name] is here for CPE.  He reports taking Lisinopril 10mg  daily and has started once weekly vit d. He continues to walk >2 miles 5 days a week and is quite frustrated that he has not lost any  additional wt since last OV. He estimates to drink 40-60 oz water/day, with afternoon sweat tea and occasional coke.  He denies tobacco/ETOH use.   He eats the same meals daily- breakfast grits, lunch salmon   Healthcare Maintenance: Colonoscopy  02/20/17 OV: Mr. [name] [name] here for f/u: HTN, wt check, and anxiety.  He is quite frustrated that he is not losing wt despite walking 2 miles every day (has been doing this >12 months). He reports medication compliance- Lisinopril and ergocalciferol-denies SE. He reports being "really hungry all the time" and believes "that I don't eat enough".  He continues to have back and L knee pain.  He was advised to f/u with [facility] is knee pain begins to impact daily life.  05/06/17 OV: Mr. [name] [name] presents for f/u: HTN, Obesity, Anxiety.  His wt remains 275-277 He walks 2 miles/stretches everyday of week. He reports eating the "pretty much the same breakfast/dinner" daily, has not added in lunch. He reports [facility] has released him to perform "as much exercise as I can tolerate". He will be losing his insurance through his employer at end of month and would like to hold off on referral to nutritionist until new health ins. Is secured. He reports taking Lisinopril 10mg  each morning at 1100- he has not rx this am  12/11/17 OV: Mr. [name] [name] is here for regular f/u: HTN, Obesity, Anxiety He continues to walk 2 miles/day and "really tries follow heart healthy diet". He  is quite frustrated that he has gained 3 lbs and his blood pressure remains abive goal despite his healthy lifestyle efforts. He also continues to struggle with anxiety, again declined CBT referral or starting medication. Due to sig HTN, oral weight loss medication is not appropriate. We discussed starting Liraglutide to augment regular exercise and healthy eating to help achieve a healthy BMI  10/23/2018 OV: Mr. [name] [name] calls in today regular f/u: HTN Ambulatory BP: SBP: 130s DBP:  80s He denies CP/chest tightness with exertion He has been walking 2 miles/day, will resume regular gym work-outs once gyms re-open He continues to abstain from tobacco/vape/ETOH use He denies acute complaints today  Patient Care Team    Relationship Specialty Notifications Start End  Esaw Grandchild, NP PCP - General Family Medicine  10/24/16     Patient Active Problem List   Diagnosis Date Noted  . Atypical mole 01/01/2018  . BMI 38.0-38.9,adult 01/01/2018  . Morbid obesity ([place]) 02/20/2017  . Anxiety about health 12/31/2016  . Hypertension 10/24/2016  . Healthcare maintenance 10/24/2016  . Seasonal allergies 10/24/2016  . Lumbar back pain with radiculopathy affecting left lower extremity 06/05/2016  . Left knee pain 06/05/2016  . Hypokalemia 02/10/2014  . Pancreatitis 02/05/2014  . Acute pancreatitis 02/05/2014  . Cholelithiasis 02/05/2014  . Hepatic steatosis 02/05/2014  . Elevated LFTs 02/05/2014     Past Medical History:  Diagnosis Date  . Anxiety   . Arthritis    hip pain from time to time   . Hernia of abdominal cavity "born w/it"  . Kidney stones      Past Surgical History:  Procedure Laterality Date  . CHOLECYSTECTOMY N/A 02/08/2014   Procedure: ATTEMPTED LAPAROSCOPIC CHOLECYSTECTOMY;  Surgeon: [name], MD;  Location: [place];  Service: General;  Laterality: N/A;  . CHOLECYSTECTOMY N/A 02/08/2014   rocedure: CHOLECYSTECTOMY;  Surgeon: [name], MD;  Location: [place];  Service: General;  Laterality: N/A;  . HERNIA REPAIR    . INSERTION OF MESH N/A 09/29/2015   Procedure: INSERTION OF MESH;  Surgeon: [name], MD;  Location: [place];  Service: General;  Laterality: N/A;  . URETEROLITHOTOMY  1989  . VENTRAL HERNIA REPAIR N/A 09/29/2015   Procedure: LAPAROSCOPIC VENTRAL HERNIA;  Surgeon: [name], MD;  Location: [facility];  Service: General;  Laterality: N/A;     Family History  Problem Relation Age of Onset  . Asthma Mother   . COPD Mother    . Cancer Father        bone  . Glaucoma Father   . Healthy Brother      Social History   Substance and Sexual Activity  Drug Use No     Social History   Substance and Sexual Activity  Alcohol Use No     Social History   Tobacco Use  Smoking Status Never Smoker  Smokeless Tobacco Never Used     Outpatient Encounter Medications as of 10/23/2018  Medication Sig  . acetaminophen (TYLENOL) 325 MG tablet Take 650 mg by mouth every 6 (six) hours as needed for mild pain.  . Cholecalciferol (VITAMIN D3) 50 MCG (2000 UT) capsule Take 2,000 Units by mouth daily.  Marland Kitchen lisinopril (ZESTRIL) 20 MG tablet Take 1 tablet (20 mg total) by mouth daily. PATIENT MUST HAVE OFFICE VISIT PRIOR TO ANY FURTHER REFILLS   No facility-administered encounter medications on file as of 10/23/2018.     Allergies: Prednisone  Body mass index is 37.94 kg/m.  Blood pressure 135/85,  temperature 97.9 F (36.6 C), temperature source Oral, height 5\' 11"  (1.803 m), weight 272 lb (123.4 kg).  Observations/Objective: No acute distress noted during the telephone conversation  Assessment and Plan: Continue Lisinopril 20mg  QD Continue regular walking Heart healthy diet Continue to social distance and wear a mask when in public  Follow Up Instructions: 2 months CPE, fasting labs the week prior   I discussed the assessment and treatment plan with the patient. The patient was provided an opportunity to ask questions and all were answered. The patient agreed with the plan and demonstrated an understanding of the instructions.   The patient was advised to call back or seek an in-person evaluation if the symptoms worsen or if the condition fails to improve as anticipated.  I provided 8 minutes of non-face-to-face time during this encounter.   Esaw Grandchild, NP

## 2018-10-23 NOTE — Assessment & Plan Note (Signed)
Referred to derm 12/2017

## 2018-10-23 NOTE — Assessment & Plan Note (Signed)
Assessment and Plan: Continue Lisinopril 20mg  QD Continue regular walking Heart healthy diet Continue to social distance and wear a mask when in public  Follow Up Instructions: 2 months CPE, fasting labs the week prior   I discussed the assessment and treatment plan with the patient. The patient was provided an opportunity to ask questions and all were answered. The patient agreed with the plan and demonstrated an understanding of the instructions.   The patient was advised to call back or seek an in-person evaluation if the symptoms worsen or if the condition fails to improve as anticipated.

## 2019-01-06 ENCOUNTER — Other Ambulatory Visit: Payer: Self-pay

## 2019-01-06 ENCOUNTER — Other Ambulatory Visit: Payer: No Typology Code available for payment source

## 2019-01-06 DIAGNOSIS — I10 Essential (primary) hypertension: Secondary | ICD-10-CM

## 2019-01-06 DIAGNOSIS — Z0000 Encounter for general adult medical examination without abnormal findings: Secondary | ICD-10-CM

## 2019-01-06 DIAGNOSIS — R7989 Other specified abnormal findings of blood chemistry: Secondary | ICD-10-CM

## 2019-01-06 DIAGNOSIS — E876 Hypokalemia: Secondary | ICD-10-CM

## 2019-01-07 LAB — CBC WITH DIFFERENTIAL/PLATELET
Basophils Absolute: 0 10*3/uL (ref 0.0–0.2)
Basos: 1 %
EOS (ABSOLUTE): 0.2 10*3/uL (ref 0.0–0.4)
Eos: 3 %
Hematocrit: 44.2 % (ref 37.5–51.0)
Hemoglobin: 15.8 g/dL (ref 13.0–17.7)
Immature Grans (Abs): 0 10*3/uL (ref 0.0–0.1)
Immature Granulocytes: 0 %
Lymphocytes Absolute: 2.5 10*3/uL (ref 0.7–3.1)
Lymphs: 38 %
MCH: 30.8 pg (ref 26.6–33.0)
MCHC: 35.7 g/dL (ref 31.5–35.7)
MCV: 86 fL (ref 79–97)
Monocytes Absolute: 0.6 10*3/uL (ref 0.1–0.9)
Monocytes: 9 %
Neutrophils Absolute: 3.3 10*3/uL (ref 1.4–7.0)
Neutrophils: 49 %
Platelets: 285 10*3/uL (ref 150–450)
RBC: 5.13 x10E6/uL (ref 4.14–5.80)
RDW: 12 % (ref 11.6–15.4)
WBC: 6.6 10*3/uL (ref 3.4–10.8)

## 2019-01-07 LAB — COMPREHENSIVE METABOLIC PANEL
ALT: 29 IU/L (ref 0–44)
AST: 26 IU/L (ref 0–40)
Albumin/Globulin Ratio: 1.6 (ref 1.2–2.2)
Albumin: 4.5 g/dL (ref 4.0–5.0)
Alkaline Phosphatase: 90 IU/L (ref 39–117)
BUN/Creatinine Ratio: 14 (ref 9–20)
BUN: 14 mg/dL (ref 6–24)
Bilirubin Total: 0.6 mg/dL (ref 0.0–1.2)
CO2: 25 mmol/L (ref 20–29)
Calcium: 9.6 mg/dL (ref 8.7–10.2)
Chloride: 101 mmol/L (ref 96–106)
Creatinine, Ser: 0.99 mg/dL (ref 0.76–1.27)
GFR calc Af Amer: 102 mL/min/{1.73_m2} (ref >59)
GFR calc non Af Amer: 88 mL/min/{1.73_m2} (ref >59)
Globulin, Total: 2.9 g/dL (ref 1.5–4.5)
Glucose: 117 mg/dL — ABNORMAL HIGH (ref 65–99)
Potassium: 4.3 mmol/L (ref 3.5–5.2)
Sodium: 140 mmol/L (ref 134–144)
Total Protein: 7.4 g/dL (ref 6.0–8.5)

## 2019-01-07 LAB — LIPID PANEL
Chol/HDL Ratio: 4.6 ratio (ref 0.0–5.0)
Cholesterol, Total: 170 mg/dL (ref 100–199)
HDL: 37 mg/dL — ABNORMAL LOW (ref >39)
LDL Chol Calc (NIH): 111 mg/dL — ABNORMAL HIGH (ref 0–99)
Triglycerides: 120 mg/dL (ref 0–149)
VLDL Cholesterol Cal: 22 mg/dL (ref 5–40)

## 2019-01-07 LAB — HEMOGLOBIN A1C
Est. average glucose Bld gHb Est-mCnc: 105 mg/dL
Hgb A1c MFr Bld: 5.3 % (ref 4.8–5.6)

## 2019-01-07 LAB — TSH: TSH: 1.6 u[IU]/mL (ref 0.450–4.500)

## 2019-01-12 NOTE — Progress Notes (Signed)
Subjective:    Patient ID: [name] [name], male    DOB: 1971/01/05, 48 y.o.   MRN: [id number]  HPI:12/11/17 OV: Mr. Lebert is here for regular f/u: HTN, Obesity, Anxiety He continues to walk 2 miles/day and "really tries follow heart healthy diet". He is quite frustrated that he has gained 3 lbs and his blood pressure remains abive goal despite his healthy lifestyle efforts. He also continues to struggle with anxiety, again declined CBT referral or starting medication. Due to sig HTN, oral weight loss medication is not appropriate. We discussed starting Liraglutide to augment regular exercise and healthy eating to help achieve a healthy BMI 10/23/2018 OV: Mr. Nabor calls in today regular f/u: HTN Ambulatory BP: SBP: 130s DBP: 80s He denies CP/chest tightness with exertion He has been walking 2 miles/day, will resume regular gym work-outs once gyms re-open He continues to abstain from tobacco/vape/ETOH use He denies acute complaints today  01/13/2019 OV:  Mr. Blazejewski is here for CPE He has dramatically increased daily walking Walking 2-4 miles/day He stopped eating sugar 29 Dec 2018- great! He has also been reducing saturated fat/CHO Current wt 274 He checks his BP while at various drug stores, has been elevated-  SBP 130-150 DBP 80-100 He denies chest pain/pressure/dyspnea when walking. He waked golf course last weekend and states "I've never felt better". He estimates to drink >60 oz water/day He continues to abstain from tobacco/vape/ETOH use  01/06/2019 Labs: TSH-WNL, 1.600  A1c-WNL, 5.3  CMP-stable  CBC-stable  The 10-year ASCVD risk score Mikey Bussing DC Jr., et al., 2013) is: 4.9%  Values used to calculate the score:   Age: 82 years   Sex: Male   Is Non-Hispanic African American: No   Diabetic: No   Tobacco smoker: No   Systolic Blood Pressure: A999333 mmHg   Is BP treated: Yes   HDL Cholesterol: 37 mg/dL   Total Cholesterol: 170  mg/dL  LDL-111, LDL last year -[place] Maintenance: Colonoscopy-declined, will consider Cologuard, IFOBT provided Immunizations-declined influenza vaccine today Due to body habitus- recommended prostate examination by Urology  Patient Care Team    Relationship Specialty Notifications Start End  Esaw Grandchild, NP PCP - General Family Medicine  10/24/16     Patient Active Problem List   Diagnosis Date Noted  . Atypical mole 01/01/2018  . BMI 38.0-38.9,adult 01/01/2018  . Anxiety about health 12/31/2016  . Hypertension 10/24/2016  . Healthcare maintenance 10/24/2016  . Seasonal allergies 10/24/2016  . Lumbar back pain with radiculopathy affecting left lower extremity 06/05/2016  . Left knee pain 06/05/2016  . Hypokalemia 02/10/2014  . Pancreatitis 02/05/2014  . Acute pancreatitis 02/05/2014  . Cholelithiasis 02/05/2014  . Hepatic steatosis 02/05/2014  . Elevated LFTs 02/05/2014     Past Medical History:  Diagnosis Date  . Anxiety   . Arthritis    hip pain from time to time   . Hernia of abdominal cavity "born w/it"  . Kidney stones      Past Surgical History:  Procedure Laterality Date  . CHOLECYSTECTOMY N/A 02/08/2014   Procedure: ATTEMPTED LAPAROSCOPIC CHOLECYSTECTOMY;  Surgeon: [name], MD;  Location: [place];  Service: General;  Laterality: N/A;  . CHOLECYSTECTOMY N/A 02/08/2014   rocedure: CHOLECYSTECTOMY;  Surgeon: [name], MD;  Location: [place];  Service: General;  Laterality: N/A;  . HERNIA REPAIR    . INSERTION OF MESH N/A 09/29/2015   Procedure: INSERTION OF MESH;  Surgeon: [name], MD;  Location: [facility];  Service: General;  Laterality: N/A;  . URETEROLITHOTOMY  1989  . VENTRAL HERNIA REPAIR N/A 09/29/2015   Procedure: LAPAROSCOPIC VENTRAL HERNIA;  Surgeon: [name], MD;  Location: [facility];  Service: General;  Laterality: N/A;     Family History  Problem Relation Age of Onset  . Asthma Mother   . COPD Mother   . Cancer  Father        bone  . Glaucoma Father   . Healthy Brother      Social History   Substance and Sexual Activity  Drug Use No     Social History   Substance and Sexual Activity  Alcohol Use No     Social History   Tobacco Use  Smoking Status Never Smoker  Smokeless Tobacco Never Used     Outpatient Encounter Medications as of 01/13/2019  Medication Sig  . acetaminophen (TYLENOL) 325 MG tablet Take 650 mg by mouth every 6 (six) hours as needed for mild pain.  . Cholecalciferol (VITAMIN D3) 50 MCG (2000 UT) capsule Take 2,000 Units by mouth daily.  Marland Kitchen lisinopril (ZESTRIL) 30 MG tablet Take one tablet by mouth once daily  . [DISCONTINUED] lisinopril (ZESTRIL) 20 MG tablet Take one tablet by mouth once daily  . [DISCONTINUED] lisinopril (ZESTRIL) 40 MG tablet Take one tablet by mouth once daily   No facility-administered encounter medications on file as of 01/13/2019.     Allergies: Prednisone  Body mass index is 38.07 kg/m.  Blood pressure 129/75, pulse 82, temperature 98.9 F (37.2 C), temperature source Oral, height 5' 11.25" (1.81 m), weight 274 lb 14.4 oz (124.7 kg), SpO2 98 %.     Review of Systems  Constitutional: Positive for fatigue. Negative for activity change, appetite change, chills, diaphoresis, fever and unexpected weight change.  HENT: Negative for congestion.   Eyes: Negative for visual disturbance.  Respiratory: Negative for cough, chest tightness, shortness of breath, wheezing and stridor.   Cardiovascular: Negative for chest pain, palpitations and leg swelling.  Gastrointestinal: Negative for abdominal distention, abdominal pain, blood in stool, constipation, diarrhea, nausea and vomiting.  Endocrine: Negative for polydipsia, polyphagia and polyuria.  Genitourinary: Negative for difficulty urinating and flank pain.  Musculoskeletal: Negative for arthralgias, back pain, gait problem, joint swelling, myalgias, neck pain and neck stiffness.   Neurological: Negative for dizziness and headaches.  Hematological: Negative for adenopathy. Does not bruise/bleed easily.  Psychiatric/Behavioral: Negative for agitation, behavioral problems, confusion, decreased concentration, dysphoric mood, hallucinations, self-injury, sleep disturbance and suicidal ideas. The patient is not nervous/anxious and is not hyperactive.        Objective:   Physical Exam Vitals signs and nursing note reviewed.  Constitutional:      General: He is not in acute distress.    Appearance: Normal appearance. He is obese. He is not ill-appearing, toxic-appearing or diaphoretic.  HENT:     Head: Normocephalic and atraumatic.     Right Ear: Tympanic membrane, ear canal and external ear normal. There is no impacted cerumen.     Left Ear: Tympanic membrane, ear canal and external ear normal. There is no impacted cerumen.     Nose: Nose normal. No congestion or rhinorrhea.     Mouth/Throat:     Mouth: Mucous membranes are moist.     Pharynx: No oropharyngeal exudate or posterior oropharyngeal erythema.  Eyes:     Extraocular Movements: Extraocular movements intact.     Conjunctiva/sclera: Conjunctivae normal.     Pupils: Pupils are equal, round, and reactive  to light.  Neck:     Musculoskeletal: Normal range of motion and neck supple. No muscular tenderness.  Cardiovascular:     Rate and Rhythm: Normal rate and regular rhythm.     Pulses: Normal pulses.     Heart sounds: Normal heart sounds. No murmur. No friction rub. No gallop.   Pulmonary:     Effort: Pulmonary effort is normal. No respiratory distress.     Breath sounds: Normal breath sounds. No stridor. No wheezing, rhonchi or rales.  Chest:     Chest wall: No tenderness.  Abdominal:     General: Abdomen is protuberant. Bowel sounds are normal.     Palpations: Abdomen is soft.     Tenderness: There is no right CVA tenderness or left CVA tenderness.  Genitourinary:    Comments: Due to body habitus-  recommended prostate examination by Urology. Skin:    General: Skin is warm and dry.     Capillary Refill: Capillary refill takes less than 2 seconds.  Neurological:     Mental Status: He is alert and oriented to person, place, and time.     Coordination: Coordination normal.  Psychiatric:        Attention and Perception: Attention and perception normal.        Mood and Affect: Mood is anxious.        Speech: Speech normal.        Behavior: Behavior normal.        Thought Content: Thought content normal.        Cognition and Memory: Cognition and memory normal.        Judgment: Judgment normal.       Assessment & Plan:   1. Elevated LDL cholesterol level   2. Hypertension, unspecified type   3. Healthcare maintenance   4. BMI 38.0-38.9,adult   5. Hypokalemia     Hypertension Blood pressure is above goal. Increase Lisinopril to 30mg  once daily. Re-check CMP in 4 weeks Purchase blood pressure (BP) cuff that checks pressure around upper arm. Check BP and heart rate once daily- record, follow-up in 2 weeks via TeleMedicine.   Healthcare maintenance Blood pressure is above goal. Increase Lisinopril to 30mg  once daily. Want to re-check CMP in 4 weeks, after increase in Lisinopril- please schedule non-fasting lab appt. Purchase blood pressure (BP) cuff that checks pressure around upper arm. Check BP and heart rate once daily- record, follow-up in 2 weeks via TeleMedicine. Continue regular walking and healthy eating. GREAT JOB on reducing sugar intake! Due to body habitus- recommended prostate examination by Urology. Recommend full body mole check by Dermatology. Recommend re-checking cholesterol panel at regular follow-up in 3 months. Continue to social distance and wear a mask when in public.  BMI 38.0-38.9,adult Walking 2-4 miles/day He stopped eating sugar 29 Dec 2018- great! He has also been reducing saturated fat/CHO Current wt 274 Body mass index is 38.07  kg/m.   Hypokalemia 01/06/2019 CMP K 4.3    FOLLOW-UP:  Return in about 2 weeks (around 01/27/2019) for TeleMedicine Follow-Up.

## 2019-01-13 ENCOUNTER — Ambulatory Visit (INDEPENDENT_AMBULATORY_CARE_PROVIDER_SITE_OTHER): Payer: No Typology Code available for payment source | Admitting: Adult Health

## 2019-01-13 ENCOUNTER — Encounter: Payer: Self-pay | Admitting: Adult Health

## 2019-01-13 ENCOUNTER — Other Ambulatory Visit: Payer: Self-pay

## 2019-01-13 VITALS — BP 129/75 | HR 82 | Temp 98.9°F | Ht 71.25 in | Wt 274.9 lb

## 2019-01-13 DIAGNOSIS — Z0000 Encounter for general adult medical examination without abnormal findings: Secondary | ICD-10-CM

## 2019-01-13 DIAGNOSIS — E876 Hypokalemia: Secondary | ICD-10-CM

## 2019-01-13 DIAGNOSIS — E7800 Pure hypercholesterolemia, unspecified: Secondary | ICD-10-CM

## 2019-01-13 DIAGNOSIS — I10 Essential (primary) hypertension: Secondary | ICD-10-CM

## 2019-01-13 DIAGNOSIS — Z6838 Body mass index (BMI) 38.0-38.9, adult: Secondary | ICD-10-CM | POA: Diagnosis not present

## 2019-01-13 MED ORDER — LISINOPRIL 40 MG PO TABS: ORAL_TABLET | ORAL | 0 refills | Status: DC

## 2019-01-13 MED ORDER — LISINOPRIL 30 MG PO TABS: ORAL_TABLET | ORAL | 0 refills | Status: DC

## 2019-01-13 NOTE — Assessment & Plan Note (Signed)
Walking 2-4 miles/day He stopped eating sugar 29 Dec 2018- great! He has also been reducing saturated fat/CHO Current wt 274 Body mass index is 38.07 kg/m.

## 2019-01-13 NOTE — Assessment & Plan Note (Addendum)
Blood pressure is above goal. Increase Lisinopril to 30mg  once daily. Want to re-check CMP in 4 weeks, after increase in Lisinopril- please schedule non-fasting lab appt. Purchase blood pressure (BP) cuff that checks pressure around upper arm. Check BP and heart rate once daily- record, follow-up in 2 weeks via TeleMedicine. Continue regular walking and healthy eating. GREAT JOB on reducing sugar intake! Due to body habitus- recommended prostate examination by Urology. Recommend full body mole check by Dermatology. Recommend re-checking cholesterol panel at regular follow-up in 3 months. Continue to social distance and wear a mask when in public.

## 2019-01-13 NOTE — Patient Instructions (Addendum)

## 2019-01-13 NOTE — Assessment & Plan Note (Addendum)
Blood pressure is above goal. Increase Lisinopril to 30mg  once daily. Re-check CMP in 4 weeks Purchase blood pressure (BP) cuff that checks pressure around upper arm. Check BP and heart rate once daily- record, follow-up in 2 weeks via TeleMedicine.

## 2019-01-13 NOTE — Assessment & Plan Note (Signed)
01/06/2019 CMP K 4.3

## 2019-01-27 ENCOUNTER — Ambulatory Visit (INDEPENDENT_AMBULATORY_CARE_PROVIDER_SITE_OTHER): Payer: No Typology Code available for payment source | Admitting: Adult Health

## 2019-01-27 ENCOUNTER — Other Ambulatory Visit: Payer: Self-pay

## 2019-01-27 ENCOUNTER — Encounter: Payer: Self-pay | Admitting: Adult Health

## 2019-01-27 DIAGNOSIS — Z6837 Body mass index (BMI) 37.0-37.9, adult: Secondary | ICD-10-CM | POA: Diagnosis not present

## 2019-01-27 DIAGNOSIS — I10 Essential (primary) hypertension: Secondary | ICD-10-CM | POA: Diagnosis not present

## 2019-01-27 DIAGNOSIS — K760 Fatty (change of) liver, not elsewhere classified: Secondary | ICD-10-CM

## 2019-01-27 NOTE — Assessment & Plan Note (Signed)
He has lost >6 lbs since last OV Current wt 268 Body mass index is 37.12 kg/m. Goal wt 225

## 2019-01-27 NOTE — Progress Notes (Signed)
Virtual Visit via Telephone Note  I connected with [name] [name] on 01/27/19 at  2:30 PM EST by telephone and verified that I am speaking with the correct person using two identifiers.  Location: Patient: Home Provider: In Clinic   I discussed the limitations, risks, security and privacy concerns of performing an evaluation and management service by telephone and the availability of in person appointments. I also discussed with the patient that there may be a patient responsible charge related to this service. The patient expressed understanding and agreed to proceed.   History of Present Illness: 01/13/2019 OV:  [name] [name] is here for CPE He has dramatically increased daily walking Walking 2-4 miles/day He stopped eating sugar 29 Dec 2018- great! He has also been reducing saturated fat/CHO Current wt 274 He checks his BP while at various drug stores, has been elevated-  SBP 130-150 DBP 80-100 He denies chest pain/pressure/dyspnea when walking. He waked golf course last weekend and states "I've never felt better". He estimates to drink >60 oz water/day He continues to abstain from tobacco/vape/ETOH use  01/06/2019 Labs: TSH-WNL, 1.600  A1c-WNL, 5.3  CMP-stable  CBC-stable  The 10-year ASCVD risk score Mikey Bussing DC Jr., et al., 2013) is: 4.9%  Values used to calculate the score:   Age: 48 years   Sex: Male   Is Non-Hispanic African American: No   Diabetic: No   Tobacco smoker: No   Systolic Blood Pressure: A999333 mmHg   Is BP treated: Yes   HDL Cholesterol: 37 mg/dL   Total Cholesterol: 170 mg/dL  LDL-111, LDL last year -[place] Maintenance: Colonoscopy-declined, will consider Cologuard, IFOBT provided Immunizations-declined influenza vaccine today Due to body habitus- recommended prostate examination by Urology 01/27/2019 OV: [name] [name] calls in for 3 week f/u: HTN Lisinopril was increased to Lisinopril 30mg  QD Ambulatory  BP SBP 130-150s DBP 90-110 HR 80s He denies CP/chest tightness with exertion. He denies HA/dizziness He estimates to drink  5 days a week- walking >3.5  1 month sugar free He has lost >6 lbs since last OV Current wt 268 Body mass index is 37.12 kg/m. Goal wt 225 CMP will be checked next week  Patient Care Team    Relationship Specialty Notifications Start End  Esaw Grandchild, NP PCP - General Family Medicine  10/24/16     Patient Active Problem List   Diagnosis Date Noted  . Atypical mole 01/01/2018  . BMI 38.0-38.9,adult 01/01/2018  . Anxiety about health 12/31/2016  . Hypertension 10/24/2016  . Healthcare maintenance 10/24/2016  . Seasonal allergies 10/24/2016  . Lumbar back pain with radiculopathy affecting left lower extremity 06/05/2016  . Left knee pain 06/05/2016  . Hypokalemia 02/10/2014  . Pancreatitis 02/05/2014  . Acute pancreatitis 02/05/2014  . Cholelithiasis 02/05/2014  . Hepatic steatosis 02/05/2014  . Elevated LFTs 02/05/2014     Past Medical History:  Diagnosis Date  . Anxiety   . Arthritis    hip pain from time to time   . Hernia of abdominal cavity "born w/it"  . Kidney stones      Past Surgical History:  Procedure Laterality Date  . CHOLECYSTECTOMY N/A 02/08/2014   Procedure: ATTEMPTED LAPAROSCOPIC CHOLECYSTECTOMY;  Surgeon: [name], MD;  Location: [place];  Service: General;  Laterality: N/A;  . CHOLECYSTECTOMY N/A 02/08/2014   rocedure: CHOLECYSTECTOMY;  Surgeon: [name], MD;  Location: [place];  Service: General;  Laterality: N/A;  . HERNIA REPAIR    . INSERTION OF MESH N/A 09/29/2015  Procedure: INSERTION OF MESH;  Surgeon: [name], MD;  Location: [facility];  Service: General;  Laterality: N/A;  . URETEROLITHOTOMY  1989  . VENTRAL HERNIA REPAIR N/A 09/29/2015   Procedure: LAPAROSCOPIC VENTRAL HERNIA;  Surgeon: [name], MD;  Location: [facility];  Service: General;  Laterality: N/A;     Family History  Problem  Relation Age of Onset  . Asthma Mother   . COPD Mother   . Cancer Father        bone  . Glaucoma Father   . Healthy Brother      Social History   Substance and Sexual Activity  Drug Use No     Social History   Substance and Sexual Activity  Alcohol Use No     Social History   Tobacco Use  Smoking Status Never Smoker  Smokeless Tobacco Never Used     Outpatient Encounter Medications as of 01/27/2019  Medication Sig  . acetaminophen (TYLENOL) 325 MG tablet Take 650 mg by mouth every 6 (six) hours as needed for mild pain.  . Cholecalciferol (VITAMIN D3) 50 MCG (2000 UT) capsule Take 2,000 Units by mouth daily.  Marland Kitchen lisinopril (ZESTRIL) 30 MG tablet Take one tablet by mouth once daily   No facility-administered encounter medications on file as of 01/27/2019.     Allergies: Prednisone  Body mass index is 37.12 kg/m.  Blood pressure (!) 142/95, pulse 89, height 5' 11.25" (1.81 m), weight 268 lb (121.6 kg). Review of Systems: General:   Denies fever, chills, unexplained weight loss.  Optho/Auditory:   Denies visual changes, blurred vision/LOV Respiratory:   Denies SOB, DOE more than baseline levels.  Cardiovascular:   Denies chest pain, palpitations, new onset peripheral edema  Gastrointestinal:   Denies nausea, vomiting, diarrhea.  Genitourinary: Denies dysuria, freq/ urgency, flank pain or discharge from genitals.  Endocrine:     Denies hot or cold intolerance, polyuria, polydipsia. Musculoskeletal:   Denies unexplained myalgias, joint swelling, unexplained arthralgias, gait problems.  Skin:  Denies rash, suspicious lesions Neurological:     Denies dizziness, unexplained weakness, numbness  Psychiatric/Behavioral:   Denies mood changes, suicidal or homicidal ideations, hallucinations  Observations/Objective: No acute distress note during telephone conversation.  Assessment and Plan: Continue to check BP/HR daily If consistently >140/90 or <100/60- call  clinic Continue excellent walking program and healthy eating. Remain well hydrated and continue to abstain from sugar and reducing saturated fat. CMP check next week OV with fasting labs 6 weeks. Continue to social distance and wear a mask when in public.  Follow Up Instructions: CMP check next week OV with fasting labs 6 weeks.   I discussed the assessment and treatment plan with the patient. The patient was provided an opportunity to ask questions and all were answered. The patient agreed with the plan and demonstrated an understanding of the instructions.   The patient was advised to call back or seek an in-person evaluation if the symptoms worsen or if the condition fails to improve as anticipated.  I provided 8 minutes of non-face-to-face time during this encounter.   Esaw Grandchild, NP

## 2019-01-27 NOTE — Assessment & Plan Note (Signed)
Assessment and Plan: Continue to check BP/HR daily If consistently >140/90 or <100/60- call clinic Continue excellent walking program and healthy eating. Remain well hydrated and continue to abstain from sugar and reducing saturated fat. CMP check next week OV with fasting labs 6 weeks. Continue to social distance and wear a mask when in public.  Follow Up Instructions: CMP check next week OV with fasting labs 6 weeks.   I discussed the assessment and treatment plan with the patient. The patient was provided an opportunity to ask questions and all were answered. The patient agreed with the plan and demonstrated an understanding of the instructions.   The patient was advised to call back or seek an in-person evaluation if the symptoms worsen or if the condition fails to improve as anticipated.

## 2019-02-04 ENCOUNTER — Other Ambulatory Visit: Payer: No Typology Code available for payment source

## 2019-04-13 ENCOUNTER — Other Ambulatory Visit: Payer: Self-pay | Admitting: Adult Health

## 2019-05-07 ENCOUNTER — Other Ambulatory Visit: Payer: Self-pay | Admitting: Adult Health

## 2019-05-07 NOTE — Telephone Encounter (Signed)
Pt's wife called states she was unsure if [facility] required but pt needs refill on --Advised that one was but (provider has no appt available till April)---Patient will be out of meds before then   lisinopril (ZESTRIL) 30 MG tablet [id number]   Order Details Dose, Route, Frequency: As Directed  Dispense Quantity: 30 tablet Refills: 0       Sig: TAKE 1 TABLET BY MOUTH DAILY**PATIENT NEEDS APT FOR FURTHER REFILLS**       ---Attempted to schedule pt for TELEHEALTH w/ provider (nothing available till April--- per wife pt will be out of meds by then.  --Pt's wife ask if provider will allow an emergency supply order to get pt to Appt date.  --Forwarding request to med asst.  -glh

## 2019-05-08 MED ORDER — LISINOPRIL 30 MG PO TABS: ORAL_TABLET | ORAL | 0 refills | Status: DC

## 2019-05-08 NOTE — Telephone Encounter (Signed)
15 day supply of med sent to [facility] per refill protocol. No further refills until patient is seen. AS, CMA

## 2019-05-29 ENCOUNTER — Telehealth: Payer: Self-pay | Admitting: Adult Health

## 2019-05-29 NOTE — Telephone Encounter (Signed)
Patient is aware of the below and verbalized understanding. AS, CMA 

## 2019-05-29 NOTE — Telephone Encounter (Signed)
Thank you for following RF protocol. Please tell pt to keep his appt

## 2019-05-29 NOTE — Telephone Encounter (Signed)
Patient was last seen 01/27/19 by Valetta Fuller and advised to follow up in 6 weeks. Patient was then given #30 day supply of med and advised to schedule apt with -did not comply. Patient was then given #15 day supply of med on 05/08/2019. No further refills per refill protocol.   Patient requesting supply to get him to apt with [facility] on 06/03/19. Please advise. AS, CMA

## 2019-05-29 NOTE — Telephone Encounter (Signed)
Patient called saying he has an appt next week but the last refill of his lisinopril was only the 15 day supply. He knows the office protocol but we did not have any sooner appt and he is worried about not having this med for a week. He is hoping we can issue a small refill to get him to his appt. If approved please send to [facility].

## 2019-06-03 ENCOUNTER — Encounter: Payer: Self-pay | Admitting: Family Medicine

## 2019-06-03 ENCOUNTER — Other Ambulatory Visit: Payer: Self-pay

## 2019-06-03 ENCOUNTER — Ambulatory Visit (INDEPENDENT_AMBULATORY_CARE_PROVIDER_SITE_OTHER): Payer: Self-pay | Admitting: Family Medicine

## 2019-06-03 VITALS — BP 132/90 | HR 80 | Temp 97.1°F | Ht 71.25 in | Wt 263.0 lb

## 2019-06-03 DIAGNOSIS — Z719 Counseling, unspecified: Secondary | ICD-10-CM

## 2019-06-03 DIAGNOSIS — I10 Essential (primary) hypertension: Secondary | ICD-10-CM

## 2019-06-03 MED ORDER — OLMESARTAN-AMLODIPINE-HCTZ 20-5-12.5 MG PO TABS: ORAL_TABLET | ORAL | 0 refills | Status: DC

## 2019-06-03 NOTE — Progress Notes (Signed)
Telehealth office visit note for [name] [name], D.O- at Primary Care at [facility]   I connected with current patient today and verified that I am speaking with the correct person   . Location of the patient: Home . Location of the provider: Office - This visit type was conducted due to national recommendations for restrictions regarding the COVID-19 Pandemic (e.g. social distancing) in an effort to limit this patient's exposure and mitigate transmission in our community.    - No physical exam could be performed with this format, beyond that communicated to [place] by the patient/ family members as noted.   - Additionally my office staff/ schedulers were to discuss with the patient that there may be a monetary charge related to this service, depending on their medical insurance.  My understanding is that patient understood and consented to proceed.     _________________________________________________________________________________   History of Present Illness:   I, [name] [name], am serving as Education administrator for [employer].   Morbid Obesity:  He's trying to lose weight, down to 263 from 267 prior.  Says he knows he needs to lose some more weight to help contribute to control of his blood pressure.  "I need to be 220, 215, somewhere around there, and I'm really trying hard."  Says he continues walking regularly and overall "I feel good, I feel okay, I don't hurt, I don't have any pains or anything like that."  Says his legs hurt sometimes when he plays golf, walking the courses, but "other than that, I feel really good."   HPI:  Hypertension: Denies history of CHF or heart disease.  -  His blood pressure at home has been running: 132/90 this morning, with an average around these numbers.  Notes he hasn't had his medication since Saturday because he could not refill it.  Otherwise, patient reports good compliance with medication and/or lifestyle modification.  Feels his blood  pressure has been 128-136/88-92 on average for a while.  - His denies acute concerns or problems related to treatment plan  - He denies new onset of: chest pain, exercise intolerance, shortness of breath, dizziness, visual changes, headache, lower extremity swelling or claudication.   Last 3 blood pressure readings in our office are as follows: BP Readings from Last 3 Encounters:  06/03/19 132/90  01/27/19 (!) 142/95  01/13/19 129/75   Filed Weights   06/03/19 1313  Weight: 263 lb (119.3 kg)    GAD 7 : Generalized Anxiety Score 02/20/2017  Nervous, Anxious, on Edge 1  Control/stop worrying 1  Worry too much - different things 1  Trouble relaxing 1  Restless 0  Easily annoyed or irritable 1  Afraid - awful might happen 1  Total GAD 7 Score 6  Anxiety Difficulty Not difficult at all    Depression screen [facility] 2/9 06/03/2019 01/27/2019 01/13/2019 01/01/2018 12/11/2017  Decreased Interest 0 0 0 0 1  Down, Depressed, Hopeless 0 0 1 0 0  PHQ - 2 Score 0 0 1 0 1  Altered sleeping 1 0 1 1 1   Tired, decreased energy 0 0 0 0 0  Change in appetite 0 0 0 0 0  Feeling bad or failure about yourself  0 0 0 0 0  Trouble concentrating 0 0 0 0 0  Moving slowly or fidgety/restless 0 0 0 0 0  Suicidal thoughts 0 0 0 0 0  PHQ-9 Score 1 0 2 1 2   Difficult doing work/chores Not difficult  at all - Not difficult at all Not difficult at all Not difficult at all  Some recent data might be hidden      Impression and Recommendations:    1. Hypertension, unspecified type   2. Morbid obesity ([place])   3. Health education/counseling       Of note, this is my first time meeting patient.  Patient is new to me and was previously being cared for at our office by an NP, who no longer works at [facility].   Will be seen by Lorrene Reid, PA-C in future.   Hypertension - Blood pressure currently is suboptimally controlled. - Discussed that patient's BP should be around 128/78 on a regular  basis.  - Discussed modification of treatment plan today. - Begin combination olmesartan-amlodipine-HCTZ today.  See med list. - Patient will begin with 0.5 tablet once daily, and wean up to one full tablet once daily after one week, to goal BP of 120/80.  If he has any concerns, he will let us know!   - Counseled patient on pathophysiology of disease and discussed various treatment options, which always includes dietary and lifestyle modification as first line.   - Lifestyle changes such as dash and heart healthy diets and engaging in a regular exercise program discussed extensively with patient.   - Encouraged patient to continue with weight loss goals as desired.  - Patient knows to check his blood pressure more regularly as he begins a new medication.  If he experiences side-effects such as dizziness or lightheadedness, patient knows to call into clinic with concerns.  - Otherwise, ambulatory blood pressure monitoring encouraged at least 3 times weekly.  Keep log and bring in every office visit.  Reminded patient that if they ever feel poorly in any way, to check their blood pressure and pulse.  - We will continue to monitor.   Morbid Obesity: Explained to patient what BMI refers to, and what it means medically.    Told patient to think about it as a "medical risk stratification measurement" and how increasing BMI is associated with increasing risk/ or worsening state of various diseases such as hypertension, hyperlipidemia, diabetes, premature OA, depression etc.  American Heart Association guidelines for healthy diet, basically Mediterranean diet, and exercise guidelines of 30 minutes 5 days per week or more discussed in detail.  Health counseling performed.  All questions answered.   - As part of my medical decision making, I reviewed the following data within the [place] History obtained from pt /family, CMA notes reviewed and incorporated if applicable, Labs  reviewed, Radiograph/ tests reviewed if applicable and OV notes from prior OV's with me, as well as other specialists she/he has seen since seeing me last, were all reviewed and used in my medical decision making process today.    - Additionally, when appropriate, discussion had with patient regarding our treatment plan, and their biases/concerns about that plan were used in my medical decision making today.    - The patient agreed with the plan and demonstrated an understanding of the instructions.   No barriers to understanding were identified.     - The patient was advised to call back or seek an in-person evaluation if the symptoms worsen or if the condition fails to improve as anticipated.   Return for lab-only visit in 1 mo to check CMP, after starting new BP med, w/ in-person f/up in 6-8 weeks.    Meds ordered this encounter  Medications  .  Olmesartan-amLODIPine-HCTZ 20-5-12.5 MG TABS    Sig: 0.5 tab QD *1 wk, then 1 po qd to goal BP120/80    Dispense:  90 tablet    Refill:  0    Medications Discontinued During This Encounter  Medication Reason  . lisinopril (ZESTRIL) 30 MG tablet       Time spent on visit including pre-visit chart review and post-visit care was 11 minutes.    Note:  This note was prepared with assistance of Dragon voice recognition software. Occasional wrong-word or sound-a-like substitutions may have occurred due to the inherent limitations of voice recognition software.  The [place] was signed into law in 2016 which includes the topic of electronic health records.  This provides immediate access to information in MyChart.  This includes consultation notes, operative notes, office notes, lab results and pathology reports.  If you have any questions about what you read please let us know at your next visit or call us at the office.  We are right here with you.  This document serves as a record of services personally performed by [name] Dance, DO. It was created on her behalf by [name] [name], a trained medical scribe. The creation of this record is based on the scribe's personal observations and the provider's statements to them.    The above documentation from [name] [name], medical scribe, has been reviewed by Marjory Sneddon, D.O. __________________________________________________________________________________     Patient Care Team    Relationship Specialty Notifications Start End  Esaw Grandchild, NP PCP - General Family Medicine  10/24/16      -Vitals obtained; medications/ allergies reconciled;  personal medical, social, Sx etc.histories were updated by CMA, reviewed by me and are reflected in chart   Patient Active Problem List   Diagnosis Date Noted  . Atypical mole 01/01/2018  . BMI 37.0-37.9, adult 01/01/2018  . Morbid obesity ([place]) 02/20/2017  . Anxiety about health 12/31/2016  . Hypertension 10/24/2016  . Healthcare maintenance 10/24/2016  . Seasonal allergies 10/24/2016  . Lumbar back pain with radiculopathy affecting left lower extremity 06/05/2016  . Left knee pain 06/05/2016  . Hypokalemia 02/10/2014  . Pancreatitis 02/05/2014  . Acute pancreatitis 02/05/2014  . Cholelithiasis 02/05/2014  . Hepatic steatosis 02/05/2014  . Elevated LFTs 02/05/2014     Current Meds  Medication Sig  . acetaminophen (TYLENOL) 325 MG tablet Take 650 mg by mouth every 6 (six) hours as needed for mild pain.  . Cholecalciferol (VITAMIN D3) 50 MCG (2000 UT) capsule Take 2,000 Units by mouth daily.  . [DISCONTINUED] lisinopril (ZESTRIL) 30 MG tablet TAKE 1 TABLET BY MOUTH DAILY**PATIENT NEEDS APT FOR FURTHER REFILLS**     Allergies:  Allergies  Allergen Reactions  . Prednisone Other (See Comments)    Loopy and edgy     ROS:  See above HPI for pertinent positives and negatives   Objective:   Blood pressure 132/90, pulse 80, temperature (!) 97.1 F (36.2 C), temperature source Temporal,  height 5' 11.25" (1.81 m), weight 263 lb (119.3 kg).  (if some vitals are omitted, this means that patient was UNABLE to obtain them even though they were asked to get them prior to OV today.  They were asked to call us at their earliest convenience with these once obtained. ) General: A & O * 3; sounds in no acute distress; in usual state of health.  Skin: Pt confirms warm and dry extremities and pink fingertips HEENT: Pt confirms lips non-cyanotic Chest:  Patient confirms normal chest excursion and movement Respiratory: speaking in full sentences, no conversational dyspnea; patient confirms no use of accessory muscles Psych: insight appears good, mood- appears full

## 2019-07-03 ENCOUNTER — Other Ambulatory Visit: Payer: Self-pay | Admitting: Physician Assistant

## 2019-07-03 DIAGNOSIS — I10 Essential (primary) hypertension: Secondary | ICD-10-CM

## 2019-07-03 DIAGNOSIS — Z0000 Encounter for general adult medical examination without abnormal findings: Secondary | ICD-10-CM

## 2019-07-06 ENCOUNTER — Other Ambulatory Visit: Payer: No Typology Code available for payment source

## 2019-07-06 ENCOUNTER — Other Ambulatory Visit: Payer: Self-pay

## 2019-07-06 DIAGNOSIS — Z0000 Encounter for general adult medical examination without abnormal findings: Secondary | ICD-10-CM

## 2019-07-06 DIAGNOSIS — I10 Essential (primary) hypertension: Secondary | ICD-10-CM

## 2019-07-07 LAB — COMPREHENSIVE METABOLIC PANEL
ALT: 35 IU/L (ref 0–44)
AST: 22 IU/L (ref 0–40)
Albumin/Globulin Ratio: 1.7 (ref 1.2–2.2)
Albumin: 4.4 g/dL (ref 3.8–4.9)
Alkaline Phosphatase: 83 IU/L (ref 48–121)
BUN/Creatinine Ratio: 11 (ref 9–20)
BUN: 12 mg/dL (ref 6–24)
Bilirubin Total: 0.4 mg/dL (ref 0.0–1.2)
CO2: 26 mmol/L (ref 20–29)
Calcium: 9.3 mg/dL (ref 8.7–10.2)
Chloride: 100 mmol/L (ref 96–106)
Creatinine, Ser: 1.07 mg/dL (ref 0.76–1.27)
GFR calc Af Amer: 92 mL/min/{1.73_m2} (ref >59)
GFR calc non Af Amer: 80 mL/min/{1.73_m2} (ref >59)
Globulin, Total: 2.6 g/dL (ref 1.5–4.5)
Glucose: 119 mg/dL — ABNORMAL HIGH (ref 65–99)
Potassium: 4.6 mmol/L (ref 3.5–5.2)
Sodium: 139 mmol/L (ref 134–144)
Total Protein: 7 g/dL (ref 6.0–8.5)

## 2019-09-02 ENCOUNTER — Telehealth: Payer: Self-pay | Admitting: Physician Assistant

## 2019-09-02 ENCOUNTER — Other Ambulatory Visit: Payer: Self-pay | Admitting: Family Medicine

## 2019-09-02 DIAGNOSIS — I10 Essential (primary) hypertension: Secondary | ICD-10-CM

## 2019-09-02 MED ORDER — OLMESARTAN-AMLODIPINE-HCTZ 20-5-12.5 MG PO TABS: ORAL_TABLET | ORAL | 0 refills | Status: DC

## 2019-09-02 NOTE — Telephone Encounter (Signed)
Patient is requesting a refill of his BP meds, if approved please send to [facility].

## 2019-09-02 NOTE — Addendum Note (Signed)
Addended by: Fonnie Mu on: 09/02/2019 10:31 AM   Modules accepted: Orders

## 2019-10-19 ENCOUNTER — Other Ambulatory Visit: Payer: Self-pay

## 2019-10-19 ENCOUNTER — Telehealth (INDEPENDENT_AMBULATORY_CARE_PROVIDER_SITE_OTHER): Payer: Self-pay | Admitting: Physician Assistant

## 2019-10-19 ENCOUNTER — Encounter: Payer: Self-pay | Admitting: Physician Assistant

## 2019-10-19 VITALS — BP 128/80 | Ht 73.0 in | Wt 261.0 lb

## 2019-10-19 DIAGNOSIS — Z713 Dietary counseling and surveillance: Secondary | ICD-10-CM

## 2019-10-19 DIAGNOSIS — I10 Essential (primary) hypertension: Secondary | ICD-10-CM

## 2019-10-19 MED ORDER — OLMESARTAN-AMLODIPINE-HCTZ 20-5-12.5 MG PO TABS: ORAL_TABLET | ORAL | 1 refills | Status: DC

## 2019-10-19 NOTE — Progress Notes (Signed)
Telehealth office visit note for [name] Reid, PA-C- at Primary Care at [name]   I connected with current patient today by telephone and verified that I am speaking with the correct person   . Location of the patient: Home . Location of the provider: Office - This visit type was conducted due to national recommendations for restrictions regarding the COVID-19 Pandemic (e.g. social distancing) in an effort to limit this patient's exposure and mitigate transmission in our community.    - No physical exam could be performed with this format, beyond that communicated to [place] by the patient/ family members as noted.   - Additionally my office staff/ schedulers were to discuss with the patient that there may be a monetary charge related to this service, depending on their medical insurance.  My understanding is that patient understood and consented to proceed.     _________________________________________________________________________________   History of Present Illness: Pt calls in to follow up on hypertension. Pt was started on combination of olmesartan-amlodipine-HCTZ last OV.  Patient reports he feels so much better and is tolerating medication well. Denies chest pain, palpitations, dizziness or leg swelling. Checks BP at home and readings usually range in 120s/80s with a few readings in 132/82. Pt monitors his sodium intake.  Reports he is trying to lose weight.  States his ultimate weight goal is to be at 200 pounds. He walks 2 miles every day.     GAD 7 : Generalized Anxiety Score 02/20/2017  Nervous, Anxious, on Edge 1  Control/stop worrying 1  Worry too much - different things 1  Trouble relaxing 1  Restless 0  Easily annoyed or irritable 1  Afraid - awful might happen 1  Total GAD 7 Score 6  Anxiety Difficulty Not difficult at all    Depression screen [name] [name] 2/9 10/19/2019 06/03/2019 01/27/2019 01/13/2019 01/01/2018  Decreased Interest 0 0 0 0 0  Down, Depressed, Hopeless 0  0 0 1 0  PHQ - 2 Score 0 0 0 1 0  Altered sleeping 0 1 0 1 1  Tired, decreased energy 0 0 0 0 0  Change in appetite 0 0 0 0 0  Feeling bad or failure about yourself  0 0 0 0 0  Trouble concentrating 0 0 0 0 0  Moving slowly or fidgety/restless 0 0 0 0 0  Suicidal thoughts 0 0 0 0 0  PHQ-9 Score 0 1 0 2 1  Difficult doing work/chores - Not difficult at all - Not difficult at all Not difficult at all  Some recent data might be hidden      Impression and Recommendations:     1. Hypertension, unspecified type   2. Morbid obesity ([place])   3. Weight loss counseling, encounter for     Hypertension: -BP at goal -Continue current medication regimen. -Continue ambulatory BP and pulse monitoring. -Continue low-sodium diet.  Morbid obesity: -Pt has lost 2 pounds since last OV. -Discussed with patient various management options and encouraged to continue with physical activity and start making some dietary changes.  Advised to set short-term weight goals and use my fitness pal or lose it app.   - As part of my medical decision making, I reviewed the following data within the [place] History obtained from pt /family, CMA notes reviewed and incorporated if applicable, Labs reviewed, Radiograph/ tests reviewed if applicable and OV notes from prior OV's with me, as well as any other specialists she/he has seen since  seeing me last, were all reviewed and used in my medical decision making process today.    - Additionally, when appropriate, discussion had with patient regarding our treatment plan, and their biases/concerns about that plan were used in my medical decision making today.    - The patient agreed with the plan and demonstrated an understanding of the instructions.   No barriers to understanding were identified.     - The patient was advised to call back or seek an in-person evaluation if the symptoms worsen or if the condition fails to improve as  anticipated.   Return in about 3 months (around 01/19/2020) for CPE and FBW.    No orders of the defined types were placed in this encounter.   No orders of the defined types were placed in this encounter.   There are no discontinued medications.     Time spent on visit including pre-visit chart review and post-visit care was 12 minutes.      The [place] was signed into law in 2016 which includes the topic of electronic health records.  This provides immediate access to information in MyChart.  This includes consultation notes, operative notes, office notes, lab results and pathology reports.  If you have any questions about what you read please let us know at your next visit or call us at the office.  We are right here with you.   __________________________________________________________________________________     Patient Care Team    Relationship Specialty Notifications Start End  [name] [name], [place] PCP - General Physician Assistant  07/03/19      -Vitals obtained; medications/ allergies reconciled;  personal medical, social, Sx etc.histories were updated by CMA, reviewed by me and are reflected in chart   Patient Active Problem List   Diagnosis Date Noted  . Atypical mole 01/01/2018  . BMI 37.0-37.9, adult 01/01/2018  . Morbid obesity ([place]) 02/20/2017  . Anxiety about health 12/31/2016  . Hypertension 10/24/2016  . Healthcare maintenance 10/24/2016  . Seasonal allergies 10/24/2016  . Lumbar back pain with radiculopathy affecting left lower extremity 06/05/2016  . Left knee pain 06/05/2016  . Hypokalemia 02/10/2014  . Pancreatitis 02/05/2014  . Acute pancreatitis 02/05/2014  . Cholelithiasis 02/05/2014  . Hepatic steatosis 02/05/2014  . Elevated LFTs 02/05/2014     Current Meds  Medication Sig  . acetaminophen (TYLENOL) 325 MG tablet Take 650 mg by mouth every 6 (six) hours as needed for mild pain.  . Cholecalciferol (VITAMIN D3) 50  MCG (2000 UT) capsule Take 2,000 Units by mouth daily.  . Olmesartan-amLODIPine-HCTZ 20-5-12.5 MG TABS 0.5 tab QD *1 wk, then 1 po qd to goal BP120/80     Allergies:  Allergies  Allergen Reactions  . Prednisone Other (See Comments)    Loopy and edgy     ROS:  See above HPI for pertinent positives and negatives   Objective:   Blood pressure 128/80, height 6\' 1"  (1.854 m), weight 261 lb (118.4 kg).  (if some vitals are omitted, this means that patient was UNABLE to obtain them even though they were asked to get them prior to OV today.  They were asked to call us at their earliest convenience with these once obtained. ) General: A & O * 3; sounds in no acute distress; in usual state of health.  Respiratory: speaking in full sentences, no conversational dyspnea Psych: insight appears good, mood- appears full

## 2020-01-26 ENCOUNTER — Telehealth: Payer: Self-pay | Admitting: Physician Assistant

## 2020-01-26 DIAGNOSIS — I10 Essential (primary) hypertension: Secondary | ICD-10-CM

## 2020-01-26 MED ORDER — OLMESARTAN-AMLODIPINE-HCTZ 20-5-12.5 MG PO TABS: 1.0000 | ORAL_TABLET | Freq: Every day | ORAL | 0 refills | Status: DC

## 2020-01-26 NOTE — Telephone Encounter (Signed)
60 day supply of meds sent to requested pharmacy.   Please call patient to schedule OV for further med refills per last AVS in August. AS, CMA

## 2020-01-26 NOTE — Telephone Encounter (Signed)
Tried calling patient twice and phone call would not go through. 12-7

## 2020-01-26 NOTE — Telephone Encounter (Signed)
Patient needs a refill on amplodipine. [facility]. Thanks

## 2020-01-26 NOTE — Addendum Note (Signed)
Addended by: Mickel Crow on: 01/26/2020 09:35 AM   Modules accepted: Orders

## 2020-03-28 ENCOUNTER — Telehealth: Payer: Self-pay | Admitting: Physician Assistant

## 2020-03-28 DIAGNOSIS — I10 Essential (primary) hypertension: Secondary | ICD-10-CM

## 2020-03-28 MED ORDER — OLMESARTAN-AMLODIPINE-HCTZ 20-5-12.5 MG PO TABS: 1.0000 | ORAL_TABLET | Freq: Every day | ORAL | 0 refills | Status: DC

## 2020-03-28 NOTE — Telephone Encounter (Signed)
Patient's wife called in requesting a refill on his blood pressure medication Olmesartan-amlodipine. Patient is scheduled for fbw and a cpe on the first and third of March, thanks.

## 2020-03-28 NOTE — Telephone Encounter (Signed)
No pharmacy listed. Sent to [facility]. AS, CMA

## 2020-03-28 NOTE — Addendum Note (Signed)
Addended by: Mickel Crow on: 03/28/2020 09:27 AM   Modules accepted: Orders

## 2020-04-15 ENCOUNTER — Other Ambulatory Visit: Payer: Self-pay | Admitting: Physician Assistant

## 2020-04-15 DIAGNOSIS — R7989 Other specified abnormal findings of blood chemistry: Secondary | ICD-10-CM

## 2020-04-15 DIAGNOSIS — Z0000 Encounter for general adult medical examination without abnormal findings: Secondary | ICD-10-CM

## 2020-04-15 DIAGNOSIS — I10 Essential (primary) hypertension: Secondary | ICD-10-CM

## 2020-04-19 ENCOUNTER — Other Ambulatory Visit: Payer: Self-pay

## 2020-04-21 ENCOUNTER — Ambulatory Visit (INDEPENDENT_AMBULATORY_CARE_PROVIDER_SITE_OTHER): Payer: Self-pay | Admitting: Physician Assistant

## 2020-04-21 ENCOUNTER — Encounter: Payer: Self-pay | Admitting: Physician Assistant

## 2020-04-21 ENCOUNTER — Other Ambulatory Visit: Payer: Self-pay

## 2020-04-21 VITALS — BP 140/86 | HR 108 | Temp 98.2°F | Ht 71.25 in | Wt 291.7 lb

## 2020-04-21 DIAGNOSIS — Z0000 Encounter for general adult medical examination without abnormal findings: Secondary | ICD-10-CM

## 2020-04-21 DIAGNOSIS — R7989 Other specified abnormal findings of blood chemistry: Secondary | ICD-10-CM

## 2020-04-21 DIAGNOSIS — I10 Essential (primary) hypertension: Secondary | ICD-10-CM

## 2020-04-21 MED ORDER — OLMESARTAN-AMLODIPINE-HCTZ 20-5-12.5 MG PO TABS
1.0000 | ORAL_TABLET | Freq: Every day | ORAL | 1 refills | Status: DC
Start: 2020-04-21 — End: 2020-10-21

## 2020-04-21 NOTE — Patient Instructions (Signed)

## 2020-04-21 NOTE — Progress Notes (Signed)
Male physical   Impression and Recommendations:    1. Healthcare maintenance   2. Hypertension, unspecified type   3. Morbid obesity ([place])      1) Anticipatory Guidance: Skin CA prevention- recommend to use sunscreen when outside along with skin surveillance; eating a balanced and modest diet; physical activity at least 25 minutes per day or minimum of 150 min/ week moderate to intense activity.  2) Immunizations / Screenings / Labs:   All immunizations are up-to-date per recommendations or will be updated today if pt allows.    - Patient understands with dental and vision screens they will schedule independently.  - Will obtain CBC, CMP, HgA1c, Lipid panel, and TSH when fasting, if not already done past 12 mo/ recently. -Declined colonoscopy, Tdap, Shingrix, Hep C and HIV screenings, influenza and covid vaccine.   3) Weight: Recommend to continue to improve diet habits to improve overall feelings of well being and objective health data. Improve nutrient density of diet through increasing intake of fruits and vegetables and decreasing saturated fats, white flour products and refined sugars.   4) Healthcare Maintenance:  -BP elevated today. Advised to monitor BP/pulse at home x 2 weeks and keep a log to forward to the office via mychart or telephone.  -Will notify of lab results once available. -Encourage to continue with weight loss efforts, follow a heart healthy diet, monitor sodium and increase physical activity.  -Follow up in 6 months for HTN/cmp    No orders of the defined types were placed in this encounter.   Meds ordered this encounter  Medications  . Olmesartan-amLODIPine-HCTZ 20-5-12.5 MG TABS    Sig: Take 1 tablet by mouth daily. Take 1 tablet by mouth daily    Dispense:  90 tablet    Refill:  1    Order Specific Question:   Supervising Provider    Answer:   Beatrice Lecher D [[id number]]     Return in about 6 months (around 10/22/2020) for HTN.    Gross  side effects, risk and benefits, and alternatives of medications discussed with patient.  Patient is aware that all medications have potential side effects and we are unable to predict every side effect or drug-drug interaction that may occur.  Expresses verbal understanding and consents to current therapy plan and treatment regimen.  Please see AVS handed out to patient at the end of our visit for further patient instructions/ counseling done pertaining to today's office visit.     Subjective:        CC: CPE   HPI: MALON BRANTON is a 50 y.o. male who presents to [place] [facility] today for a yearly health maintenance exam.     Health Maintenance Summary  - Reviewed and updated, unless pt declines services.  Last Cologuard or Colonoscopy: Pt declines.  Family history of Colon CA: N Tobacco History Reviewed:  Y, never a smoker Abdominal Ultrasound:  N/A CT scan for screening lung CA: N/A Alcohol / drug use:    No concerns, no excessive use / no use Dental Home: Y  Male history: STD concerns:   none, monogamous Additional penile/ urinary concerns: none   Additional concerns beyond Health Maintenance issues:  none     There is no immunization history on file for this patient.   Health Maintenance  Topic Date Due  . COVID-19 Vaccine (1) Never done  . HIV Screening  Never done  . TETANUS/TDAP  Never done  .  COLONOSCOPY (Pts 45-53yrs Insurance coverage will need to be confirmed)  Never done  . INFLUENZA VACCINE  Never done  . Hepatitis C Screening  Completed  . HPV VACCINES  Aged Out       Wt Readings from Last 3 Encounters:  04/21/20 291 lb 11.2 oz (132.3 kg)  10/19/19 261 lb (118.4 kg)  06/03/19 263 lb (119.3 kg)   BP Readings from Last 3 Encounters:  04/21/20 140/86  10/19/19 128/80  06/03/19 132/90   Pulse Readings from Last 3 Encounters:  04/21/20 (!) 108  06/03/19 80  01/27/19 89    Patient Active Problem List    Diagnosis Date Noted  . Atypical mole 01/01/2018  . BMI 37.0-37.9, adult 01/01/2018  . Morbid obesity ([place]) 02/20/2017  . Anxiety about health 12/31/2016  . Hypertension 10/24/2016  . Healthcare maintenance 10/24/2016  . Seasonal allergies 10/24/2016  . Lumbar back pain with radiculopathy affecting left lower extremity 06/05/2016  . Left knee pain 06/05/2016  . Hypokalemia 02/10/2014  . Pancreatitis 02/05/2014  . Acute pancreatitis 02/05/2014  . Cholelithiasis 02/05/2014  . Hepatic steatosis 02/05/2014  . Elevated LFTs 02/05/2014    Past Medical History:  Diagnosis Date  . Anxiety   . Arthritis    hip pain from time to time   . Hernia of abdominal cavity "born w/it"  . Kidney stones     Past Surgical History:  Procedure Laterality Date  . CHOLECYSTECTOMY N/A 02/08/2014   Procedure: ATTEMPTED LAPAROSCOPIC CHOLECYSTECTOMY;  Surgeon: [name], MD;  Location: [place];  Service: General;  Laterality: N/A;  . CHOLECYSTECTOMY N/A 02/08/2014   rocedure: CHOLECYSTECTOMY;  Surgeon: [name], MD;  Location: [place];  Service: General;  Laterality: N/A;  . HERNIA REPAIR    . INSERTION OF MESH N/A 09/29/2015   Procedure: INSERTION OF MESH;  Surgeon: [name], MD;  Location: [place];  Service: General;  Laterality: N/A;  . URETEROLITHOTOMY  1989  . VENTRAL HERNIA REPAIR N/A 09/29/2015   Procedure: LAPAROSCOPIC VENTRAL HERNIA;  Surgeon: [name], MD;  Location: [place] [facility];  Service: General;  Laterality: N/A;    Family History  Problem Relation Age of Onset  . Asthma Mother   . COPD Mother   . Cancer Father        bone  . Glaucoma Father   . Healthy Brother     Social History   Substance and Sexual Activity  Drug Use No  ,  Social History   Substance and Sexual Activity  Alcohol Use No  ,  Social History   Tobacco Use  Smoking Status Never Smoker  Smokeless Tobacco Never Used  ,  Social History   Substance and Sexual Activity  Sexual Activity Yes  .  Birth control/protection: None    Patient's Medications  New Prescriptions   No medications on file  Previous Medications   ACETAMINOPHEN (TYLENOL) 325 MG TABLET    Take 650 mg by mouth every 6 (six) hours as needed for mild pain.   CHOLECALCIFEROL (VITAMIN D3) 50 MCG (2000 UT) CAPSULE    Take 2,000 Units by mouth daily.  Modified Medications   Modified Medication Previous Medication   OLMESARTAN-AMLODIPINE-HCTZ 20-5-12.5 MG TABS Olmesartan-amLODIPine-HCTZ 20-5-12.5 MG TABS      Take 1 tablet by mouth daily. Take 1 tablet by mouth daily    Take 1 tablet by mouth daily. Take 1 tablet by mouth daily**NEEDS APT FOR REFILLS**  Discontinued Medications   No medications on file  Prednisone  Review of Systems: General:   Denies fever, chills, unexplained weight loss.  Optho/Auditory:   Denies visual changes, blurred vision/LOV Respiratory:   Denies SOB, DOE more than baseline levels.   Cardiovascular:   Denies chest pain, palpitations, new onset peripheral edema  Gastrointestinal:   Denies nausea, vomiting, diarrhea.  Genitourinary: Denies dysuria, freq/ urgency, flank pain or discharge from genitals.  Endocrine:     Denies hot or cold intolerance, polyuria, polydipsia. Musculoskeletal:   Denies unexplained myalgias, joint swelling, unexplained arthralgias, gait problems.  Skin:  Denies rash, suspicious lesions Neurological:     Denies dizziness, unexplained weakness, numbness  Psychiatric/Behavioral:   Denies mood changes, suicidal or homicidal ideations, hallucinations    Objective:     Blood pressure 140/86, pulse (!) 108, temperature 98.2 F (36.8 C), height 5' 11.25" (1.81 m), weight 291 lb 11.2 oz (132.3 kg), SpO2 94 %. Body mass index is 40.4 kg/m. General Appearance:    Alert, cooperative, no distress, appears stated age  Head:    Normocephalic, without obvious abnormality, atraumatic  Eyes:    PERRL, conjunctiva/corneas clear, EOM's intact, both eyes  Ears:    Normal  TM's and external ear canals, both ears  Nose:   Nares normal, septum midline, mucosa normal, no drainage    or sinus tenderness  Throat:   Lips w/o lesion, mucosa moist, and tongue normal; teeth and gums normal  Neck:   Supple, symmetrical, trachea midline, no adenopathy;    thyroid:  no enlargement/tenderness/nodules; no carotid   bruit or JVD  Back:     Symmetric, no curvature, ROM normal, no CVA tenderness  Lungs:     Clear to auscultation bilaterally, respirations unlabored, no Wh/ R/ R  Chest Wall:    No tenderness or gross deformity; normal excursion   Heart:    Regular rate and rhythm, S1 and S2 normal, no murmur, rub   or gallop  Abdomen:     Soft, non-tender, bowel sounds active all four quadrants, No G/R/R, no masses, no organomegaly  Genitalia:   Deferred by pt.  Rectal:   Deferred by pt.  Extremities:   Extremities normal, atraumatic, no cyanosis or gross edema  Pulses:   2+ and symmetric all extremities  Skin:   Warm, dry, Skin color, texture, turgor normal, no obvious rashes or lesions  M-Sk:   Ambulates * 4 w/o difficulty, no gross deformities, tone WNL  Neurologic:   CNII-XII intact, normal strength, sensation and reflexes    Throughout Psych:  No HI/SI, judgement and insight good, Euthymic mood. Full Affect.

## 2020-04-22 LAB — LIPID PANEL
Chol/HDL Ratio: 4.2 ratio (ref 0.0–5.0)
Cholesterol, Total: 157 mg/dL (ref 100–199)
HDL: 37 mg/dL — ABNORMAL LOW (ref >39)
LDL Chol Calc (NIH): 102 mg/dL — ABNORMAL HIGH (ref 0–99)
Triglycerides: 99 mg/dL (ref 0–149)
VLDL Cholesterol Cal: 18 mg/dL (ref 5–40)

## 2020-04-22 LAB — COMPREHENSIVE METABOLIC PANEL
ALT: 43 IU/L (ref 0–44)
AST: 31 IU/L (ref 0–40)
Albumin/Globulin Ratio: 1.5 (ref 1.2–2.2)
Albumin: 4.2 g/dL (ref 3.8–4.9)
Alkaline Phosphatase: 83 IU/L (ref 44–121)
BUN/Creatinine Ratio: 13 (ref 9–20)
BUN: 14 mg/dL (ref 6–24)
Bilirubin Total: 0.4 mg/dL (ref 0.0–1.2)
CO2: 25 mmol/L (ref 20–29)
Calcium: 9.1 mg/dL (ref 8.7–10.2)
Chloride: 103 mmol/L (ref 96–106)
Creatinine, Ser: 1.05 mg/dL (ref 0.76–1.27)
Globulin, Total: 2.8 g/dL (ref 1.5–4.5)
Glucose: 94 mg/dL (ref 65–99)
Potassium: 4.3 mmol/L (ref 3.5–5.2)
Sodium: 143 mmol/L (ref 134–144)
Total Protein: 7 g/dL (ref 6.0–8.5)
eGFR: 85 mL/min/{1.73_m2} (ref >59)

## 2020-04-22 LAB — CBC
Hematocrit: 45 % (ref 37.5–51.0)
Hemoglobin: 15.6 g/dL (ref 13.0–17.7)
MCH: 30.2 pg (ref 26.6–33.0)
MCHC: 34.7 g/dL (ref 31.5–35.7)
MCV: 87 fL (ref 79–97)
Platelets: 266 10*3/uL (ref 150–450)
RBC: 5.17 x10E6/uL (ref 4.14–5.80)
RDW: 12.8 % (ref 11.6–15.4)
WBC: 6.7 10*3/uL (ref 3.4–10.8)

## 2020-04-22 LAB — TSH: TSH: 2.24 u[IU]/mL (ref 0.450–4.500)

## 2020-04-22 LAB — HEMOGLOBIN A1C
Est. average glucose Bld gHb Est-mCnc: 111 mg/dL
Hgb A1c MFr Bld: 5.5 % (ref 4.8–5.6)

## 2020-10-21 ENCOUNTER — Other Ambulatory Visit: Payer: Self-pay

## 2020-10-21 DIAGNOSIS — I10 Essential (primary) hypertension: Secondary | ICD-10-CM

## 2020-10-21 MED ORDER — OLMESARTAN-AMLODIPINE-HCTZ 20-5-12.5 MG PO TABS: 1.0000 | ORAL_TABLET | Freq: Every day | ORAL | 0 refills | Status: DC

## 2020-10-25 ENCOUNTER — Ambulatory Visit: Payer: Self-pay | Admitting: Physician Assistant

## 2020-11-07 ENCOUNTER — Ambulatory Visit: Payer: Self-pay | Admitting: Physician Assistant

## 2020-11-25 ENCOUNTER — Other Ambulatory Visit: Payer: Self-pay

## 2020-11-25 DIAGNOSIS — I10 Essential (primary) hypertension: Secondary | ICD-10-CM

## 2020-11-25 MED ORDER — OLMESARTAN-AMLODIPINE-HCTZ 20-5-12.5 MG PO TABS: 1.0000 | ORAL_TABLET | Freq: Every day | ORAL | 0 refills | Status: DC

## 2020-12-07 ENCOUNTER — Ambulatory Visit (INDEPENDENT_AMBULATORY_CARE_PROVIDER_SITE_OTHER): Payer: Self-pay | Admitting: Physician Assistant

## 2020-12-07 ENCOUNTER — Encounter: Payer: Self-pay | Admitting: Physician Assistant

## 2020-12-07 VITALS — BP 128/78 | HR 82 | Ht 72.0 in | Wt 277.0 lb

## 2020-12-07 DIAGNOSIS — Z6837 Body mass index (BMI) 37.0-37.9, adult: Secondary | ICD-10-CM

## 2020-12-07 DIAGNOSIS — I10 Essential (primary) hypertension: Secondary | ICD-10-CM

## 2020-12-07 NOTE — Progress Notes (Signed)
Telehealth office visit note for [name] Reid, PA-C- at Primary Care at [facility]   I connected with current patient today by telephone and verified that I am speaking with the correct person    Location of the patient: Home  Location of the provider: Office - This visit type was conducted due to national recommendations for restrictions regarding the COVID-19 Pandemic (e.g. social distancing) in an effort to limit this patient's exposure and mitigate transmission in our community.    - No physical exam could be performed with this format, beyond that communicated to [place] by the patient/ family members as noted.   - Additionally my office staff/ schedulers were to discuss with the patient that there may be a monetary charge related to this service, depending on their medical insurance.  My understanding is that patient understood and consented to proceed.     _________________________________________________________________________________   History of Present Illness: Patient calls in to follow up on hypertension. Pt denies chest pain, palpitations, dizziness or leg swelling. Taking medication as directed without side effects. Checks BP at home every other day and readings range 130/78-80. Pt follows a low salt diet. Reports drinks 64 fl oz daily. States has been working on weight loss by changing his diet and walking 2 miles daily. Also plays golf few times per week. Weight goal is somewhere in the 250s.      GAD 7 : Generalized Anxiety Score 12/07/2020 02/20/2017  Nervous, Anxious, on Edge 0 1  Control/stop worrying 0 1  Worry too much - different things 1 1  Trouble relaxing 0 1  Restless 0 0  Easily annoyed or irritable 0 1  Afraid - awful might happen 0 1  Total GAD 7 Score 1 6  Anxiety Difficulty Not difficult at all Not difficult at all    Depression screen [facility] 2/9 12/07/2020 04/21/2020 10/19/2019 06/03/2019 01/27/2019  Decreased Interest 0 0 0 0 0  Down, Depressed,  Hopeless 0 0 0 0 0  PHQ - 2 Score 0 0 0 0 0  Altered sleeping 0 1 0 1 0  Tired, decreased energy 0 1 0 0 0  Change in appetite 0 0 0 0 0  Feeling bad or failure about yourself  0 0 0 0 0  Trouble concentrating 0 0 0 0 0  Moving slowly or fidgety/restless 0 0 0 0 0  Suicidal thoughts 0 0 0 0 0  PHQ-9 Score 0 2 0 1 0  Difficult doing work/chores Not difficult at all - - Not difficult at all -  Some recent data might be hidden      Impression and Recommendations:     1. Hypertension, unspecified type   2. Class 2 severe obesity with serious comorbidity and body mass index (BMI) of 37.0 to 37.9 in adult, unspecified obesity type ([name])     Hypertension: -Stable. -Continue current medication regimen. Last CMP, renal function and electrolytes normal. -Will continue to monitor. Will repeat CMP with CPE.  Class 2 severe obesity with serious comorbidity and body mass index (BMI) of 37.0 to 37.9 in adult, unspecified obesity type: -Associated with hypertension and elevated LDL cholesterol level. -Praised patient for 14 pound wt loss, recommend to continue dietary and lifestyle changes.   - As part of my medical decision making, I reviewed the following data within the [place] History obtained from pt /family, CMA notes reviewed and incorporated if applicable, Labs reviewed, Radiograph/ tests reviewed if  applicable and OV notes from prior OV's with me, as well as any other specialists she/he has seen since seeing me last, were all reviewed and used in my medical decision making process today.    - Additionally, when appropriate, discussion had with patient regarding our treatment plan, and their biases/concerns about that plan were used in my medical decision making today.    - The patient agreed with the plan and demonstrated an understanding of the instructions.   No barriers to understanding were identified.     - The patient was advised to call back or seek an  in-person evaluation if the symptoms worsen or if the condition fails to improve as anticipated.   Return in about 5 months (around 05/07/2021) for CPE and FBW .    No orders of the defined types were placed in this encounter.   No orders of the defined types were placed in this encounter.   Medications Discontinued During This Encounter  Medication Reason   acetaminophen (TYLENOL) 325 MG tablet Patient Preference       Time spent on telephone encounter was 9 minutes.  Note:  This note was prepared with assistance of Dragon voice recognition software. Occasional wrong-word or sound-a-like substitutions may have occurred due to the inherent limitations of voice recognition software.     The [place] was signed into law in 2016 which includes the topic of electronic health records.  This provides immediate access to information in MyChart.  This includes consultation notes, operative notes, office notes, lab results and pathology reports.  If you have any questions about what you read please let us know at your next visit or call us at the office.  We are right here with you.   __________________________________________________________________________________     Patient Care Team    Relationship Specialty Notifications Start End  [name] [name], [place] PCP - General Physician Assistant  07/03/19      -Vitals obtained; medications/ allergies reconciled;  personal medical, social, Sx etc.histories were updated by CMA, reviewed by me and are reflected in chart   Patient Active Problem List   Diagnosis Date Noted   Atypical mole 01/01/2018   BMI 37.0-37.9, adult 01/01/2018   Morbid obesity ([place]) 02/20/2017   Anxiety about health 12/31/2016   Hypertension 10/24/2016   Healthcare maintenance 10/24/2016   Seasonal allergies 10/24/2016   Lumbar back pain with radiculopathy affecting left lower extremity 06/05/2016   Left knee pain 06/05/2016   Hypokalemia  02/10/2014   Pancreatitis 02/05/2014   Acute pancreatitis 02/05/2014   Cholelithiasis 02/05/2014   Hepatic steatosis 02/05/2014   Elevated LFTs 02/05/2014     Current Meds  Medication Sig   Cholecalciferol (VITAMIN D3) 50 MCG (2000 UT) capsule Take 2,000 Units by mouth daily.   Olmesartan-amLODIPine-HCTZ 20-5-12.5 MG TABS Take 1 tablet by mouth daily. Take 1 tablet by mouth daily     Allergies:  Allergies  Allergen Reactions   Prednisone Other (See Comments)    Loopy and edgy     ROS:  See above HPI for pertinent positives and negatives   Objective:   Blood pressure 128/78, pulse 82, height 6' (1.829 m), weight 277 lb (125.6 kg).  (if some vitals are omitted, this means that patient was UNABLE to obtain them. ) General: A & O * 3; sounds in no acute distress Respiratory: speaking in full sentences, no conversational dyspnea Psych: insight appears good, mood- appears full

## 2021-02-22 ENCOUNTER — Other Ambulatory Visit: Payer: Self-pay | Admitting: Physician Assistant

## 2021-02-22 DIAGNOSIS — I10 Essential (primary) hypertension: Secondary | ICD-10-CM

## 2021-04-24 ENCOUNTER — Other Ambulatory Visit: Payer: Self-pay

## 2021-04-24 DIAGNOSIS — I10 Essential (primary) hypertension: Secondary | ICD-10-CM

## 2021-04-24 MED ORDER — OLMESARTAN-AMLODIPINE-HCTZ 20-5-12.5 MG PO TABS
1.0000 | ORAL_TABLET | Freq: Every day | ORAL | 0 refills | Status: DC
Start: 2021-04-24 — End: 2021-08-24

## 2021-05-08 ENCOUNTER — Encounter: Payer: Self-pay | Admitting: Physician Assistant

## 2021-06-07 ENCOUNTER — Encounter: Payer: Self-pay | Admitting: Physician Assistant

## 2021-08-24 ENCOUNTER — Other Ambulatory Visit: Payer: Self-pay

## 2021-08-24 DIAGNOSIS — I10 Essential (primary) hypertension: Secondary | ICD-10-CM

## 2021-08-24 MED ORDER — OLMESARTAN-AMLODIPINE-HCTZ 20-5-12.5 MG PO TABS
1.0000 | ORAL_TABLET | Freq: Every day | ORAL | 0 refills | Status: DC
Start: 2021-08-24 — End: 2021-09-20

## 2021-08-25 ENCOUNTER — Encounter: Payer: Self-pay | Admitting: Physician Assistant

## 2021-09-20 ENCOUNTER — Ambulatory Visit (INDEPENDENT_AMBULATORY_CARE_PROVIDER_SITE_OTHER): Payer: No Typology Code available for payment source | Admitting: Physician Assistant

## 2021-09-20 ENCOUNTER — Encounter: Payer: Self-pay | Admitting: Physician Assistant

## 2021-09-20 VITALS — BP 144/85 | HR 94 | Temp 97.7°F | Ht 72.0 in | Wt 272.0 lb

## 2021-09-20 DIAGNOSIS — I10 Essential (primary) hypertension: Secondary | ICD-10-CM

## 2021-09-20 DIAGNOSIS — Z1321 Encounter for screening for nutritional disorder: Secondary | ICD-10-CM

## 2021-09-20 DIAGNOSIS — Z0000 Encounter for general adult medical examination without abnormal findings: Secondary | ICD-10-CM

## 2021-09-20 MED ORDER — OLMESARTAN-AMLODIPINE-HCTZ 40-5-12.5 MG PO TABS: ORAL_TABLET | ORAL | 1 refills | Status: DC

## 2021-09-20 NOTE — Progress Notes (Signed)
Complete physical exam   Patient: [name] [name]   DOB: 1970-03-20   51 y.o. Male  MRN: [id number] Visit Date: 09/20/2021   Chief Complaint  Patient presents with   Annual Exam   Subjective    [name] [name] is a 50 y.o. male who presents today for a complete physical exam.  He reports consuming a  lower calorie   diet, limits to 2000 calories.  Walks 7500 steps daily.  He generally feels fairly well. He does have additional problems to discuss today.    Past Medical History:  Diagnosis Date   Anxiety    Arthritis    hip pain from time to time    Hernia of abdominal cavity "[name] w/it"   Kidney stones    Past Surgical History:  Procedure Laterality Date   CHOLECYSTECTOMY N/A 02/08/2014   Procedure: ATTEMPTED LAPAROSCOPIC CHOLECYSTECTOMY;  Surgeon: [name], MD;  Location: [facility] OR;  Service: General;  Laterality: N/A;   CHOLECYSTECTOMY N/A 02/08/2014   rocedure: [facility];  Surgeon: [name], MD;  Location: [place];  Service: General;  Laterality: N/A;   HERNIA REPAIR     INSERTION OF MESH N/A 09/29/2015   Procedure: INSERTION OF MESH;  Surgeon: [name], MD;  Location: [place];  Service: General;  Laterality: N/A;   URETEROLITHOTOMY  [place] N/A 09/29/2015   Procedure: LAPAROSCOPIC VENTRAL HERNIA;  Surgeon: [name], MD;  Location: [facility] OR;  Service: General;  Laterality: N/A;   Social History   Socioeconomic History   Marital status: Married    Spouse name: Not on file   Number of children: Not on file   Years of education: Not on file   Highest education level: Not on file  Occupational History   Not on file  Tobacco Use   Smoking status: Never   Smokeless tobacco: Never  Vaping Use   Vaping Use: Never used  Substance and Sexual Activity   Alcohol use: No   Drug use: No   Sexual activity: Yes    Birth control/protection: None  Other Topics Concern   Not on file  Social History Narrative   Not on file    Social Determinants of Health   Financial Resource Strain: Not on file  Food Insecurity: Not on file  Transportation Needs: Not on file  Physical Activity: Not on file  Stress: Not on file  Social Connections: Not on file  Intimate Partner Violence: Not on file     Medications: Outpatient Medications Prior to Visit  Medication Sig   Cholecalciferol (VITAMIN D3) 50 MCG (2000 UT) capsule Take 2,000 Units by mouth daily.   [DISCONTINUED] Olmesartan-amLODIPine-HCTZ 20-5-12.5 MG TABS Take 1 tablet by mouth daily.   No facility-administered medications prior to visit.    Review of Systems Review of Systems:  A fourteen system review of systems was performed and found to be positive as per HPI.  Last CBC Lab Results  Component Value Date   WBC 6.7 04/21/2020   HGB 15.6 04/21/2020   HCT 45.0 04/21/2020   MCV 87 04/21/2020   MCH 30.2 04/21/2020   RDW 12.8 04/21/2020   PLT 266 42/68/3419   Last metabolic panel Lab Results  Component Value Date   GLUCOSE 94 04/21/2020   NA 143 04/21/2020   K 4.3 04/21/2020   CL 103 04/21/2020   CO2 25 04/21/2020   BUN 14 04/21/2020   CREATININE 1.05 04/21/2020   EGFR 85 04/21/2020  CALCIUM 9.1 04/21/2020   PROT 7.0 04/21/2020   ALBUMIN 4.2 04/21/2020   LABGLOB 2.8 04/21/2020   AGRATIO 1.5 04/21/2020   BILITOT 0.4 04/21/2020   ALKPHOS 83 04/21/2020   AST 31 04/21/2020   ALT 43 04/21/2020   ANIONGAP 8 09/21/2015   Last lipids Lab Results  Component Value Date   CHOL 157 04/21/2020   HDL 37 (L) 04/21/2020   LDLCALC 102 (H) 04/21/2020   TRIG 99 04/21/2020   CHOLHDL 4.2 04/21/2020   Last hemoglobin A1c Lab Results  Component Value Date   HGBA1C 5.5 04/21/2020   Last thyroid functions Lab Results  Component Value Date   TSH 2.240 04/21/2020   Last vitamin D Lab Results  Component Value Date   VD25OH 33.2 05/06/2017    Objective     BP (!) 144/85   Pulse 94   Temp 97.7 F (36.5 C)   Ht 6' (1.829 m)   Wt 272  lb (123.4 kg)   SpO2 97%   BMI 36.89 kg/m  BP Readings from Last 3 Encounters:  09/20/21 (!) 144/85  12/07/20 128/78  04/21/20 140/86   Wt Readings from Last 3 Encounters:  09/20/21 272 lb (123.4 kg)  12/07/20 277 lb (125.6 kg)  04/21/20 291 lb 11.2 oz (132.3 kg)    Physical Exam   General Appearance:    Alert, cooperative, in no acute distress, appears stated age  Head:    Normocephalic, without obvious abnormality, atraumatic  Eyes:    PERRL, conjunctiva/corneas clear, EOM's intact, fundi    benign, both eyes       Ears:    Normal TM's and external ear canals, both ears  Nose:   Nares normal, septum midline, mucosa normal, no drainage   or sinus tenderness  Throat:   Lips, mucosa, and tongue normal; teeth and gums normal  Neck:   Supple, symmetrical, trachea midline, no adenopathy;       thyroid:  No enlargement/tenderness/nodules; no JVD  Back:     Symmetric, no curvature, ROM normal, no CVA tenderness  Lungs:     Clear to auscultation bilaterally, respirations unlabored  Chest wall:    No tenderness or deformity  Heart:    Normal heart rate. Normal rhythm. No murmurs, rubs, or gallops.  S1 and S2 normal  Abdomen:     Protuberant, non-tender, bowel sounds active all four quadrants, no masses, no organomegaly  Genitalia:    deferred  Rectal:    deferred  Extremities:   All extremities are intact. No cyanosis or edema  Pulses:   2+ and symmetric all extremities  Skin:   Skin color, texture, turgor normal, no rashes or lesions  Lymph nodes:   Cervical and supraclavicular nodes normal  Neurologic:   CNII-XII grossly intact.      Last depression screening scores    09/20/2021    8:16 AM 12/07/2020    8:42 AM 04/21/2020    2:26 PM  PHQ 2/9 Scores  PHQ - 2 Score 0 0 0  PHQ- 9 Score 3 0 2   Last fall risk screening    09/20/2021    8:16 AM  Fall Risk   Falls in the past year? 0  Number falls in past yr: 0  Injury with Fall? 0  Risk for fall due to : No Fall Risks   Follow up Falls evaluation completed     No results found for any visits on 09/20/21.  Assessment & Plan  Routine Health Maintenance and Physical Exam  Exercise Activities and Dietary recommendations -Discussed heart healthy diet low in salt and fat.    There is no immunization history on file for this patient.  Health Maintenance  Topic Date Due   COVID-19 Vaccine (1) Never done   HIV Screening  Never done   TETANUS/TDAP  Never done   COLONOSCOPY (Pts 45-35yr Insurance coverage will need to be confirmed)  Never done   Zoster Vaccines- Shingrix (1 of 2) Never done   INFLUENZA VACCINE  09/19/2021   Hepatitis C Screening  Completed   HPV VACCINES  Aged Out    Discussed health benefits of physical activity, and encouraged him to engage in regular exercise appropriate for his age and condition.  Problem List Items Addressed This Visit       Cardiovascular and Mediastinum   Hypertension   Relevant Medications   Olmesartan-amLODIPine-HCTZ 40-5-12.5 MG TABS   Other Relevant Orders   CBC w/Diff   Comp Met (CMET)     Other   Healthcare maintenance - Primary   Relevant Orders   CBC w/Diff   Comp Met (CMET)   Lipid Profile   HgB A1c   TSH   Other Visit Diagnoses     Encounter for vitamin deficiency screening       Relevant Orders   Vitamin D (25 hydroxy)      Will obtain routine screening labs. Pt deferred immunizations and colonoscopy/Cologuard. Patient's BP elevated and reports ambulatory readings have been >130/80 so will increase medication to olmesartan-amlodipine-HCTZ 40-5-12.5 mg. Advised to let me know if unable to tolerate increased dose and if BP readings consistently >130/80.  Encourage to continue weight loss efforts.    Return in about 6 months (around 03/23/2022) for HTN and CMP.       MLorrene Reid PA-C  [phone](phone) [phone](fax)  [place]

## 2021-09-20 NOTE — Patient Instructions (Signed)

## 2021-09-21 LAB — HEMOGLOBIN A1C
Est. average glucose Bld gHb Est-mCnc: 128 mg/dL
Hgb A1c MFr Bld: 6.1 % — ABNORMAL HIGH (ref 4.8–5.6)

## 2021-09-21 LAB — COMPREHENSIVE METABOLIC PANEL
ALT: 49 IU/L — ABNORMAL HIGH (ref 0–44)
AST: 50 IU/L — ABNORMAL HIGH (ref 0–40)
Albumin/Globulin Ratio: 1.4 (ref 1.2–2.2)
Albumin: 4.2 g/dL (ref 3.8–4.9)
Alkaline Phosphatase: 89 IU/L (ref 44–121)
BUN/Creatinine Ratio: 13 (ref 9–20)
BUN: 13 mg/dL (ref 6–24)
Bilirubin Total: 0.7 mg/dL (ref 0.0–1.2)
CO2: 24 mmol/L (ref 20–29)
Calcium: 9.3 mg/dL (ref 8.7–10.2)
Chloride: 102 mmol/L (ref 96–106)
Creatinine, Ser: 0.97 mg/dL (ref 0.76–1.27)
Globulin, Total: 2.9 g/dL (ref 1.5–4.5)
Glucose: 109 mg/dL — ABNORMAL HIGH (ref 70–99)
Potassium: 4 mmol/L (ref 3.5–5.2)
Sodium: 140 mmol/L (ref 134–144)
Total Protein: 7.1 g/dL (ref 6.0–8.5)
eGFR: 93 mL/min/{1.73_m2} (ref >59)

## 2021-09-21 LAB — CBC WITH DIFFERENTIAL/PLATELET
Basophils Absolute: 0.1 10*3/uL (ref 0.0–0.2)
Basos: 1 %
EOS (ABSOLUTE): 0.2 10*3/uL (ref 0.0–0.4)
Eos: 3 %
Hematocrit: 42.5 % (ref 37.5–51.0)
Hemoglobin: 15.3 g/dL (ref 13.0–17.7)
Immature Grans (Abs): 0 10*3/uL (ref 0.0–0.1)
Immature Granulocytes: 0 %
Lymphocytes Absolute: 1.8 10*3/uL (ref 0.7–3.1)
Lymphs: 29 %
MCH: 30.8 pg (ref 26.6–33.0)
MCHC: 36 g/dL — ABNORMAL HIGH (ref 31.5–35.7)
MCV: 86 fL (ref 79–97)
Monocytes Absolute: 0.5 10*3/uL (ref 0.1–0.9)
Monocytes: 9 %
Neutrophils Absolute: 3.5 10*3/uL (ref 1.4–7.0)
Neutrophils: 58 %
Platelets: 276 10*3/uL (ref 150–450)
RBC: 4.96 x10E6/uL (ref 4.14–5.80)
RDW: 12.4 % (ref 11.6–15.4)
WBC: 6 10*3/uL (ref 3.4–10.8)

## 2021-09-21 LAB — LIPID PANEL
Chol/HDL Ratio: 4.6 ratio (ref 0.0–5.0)
Cholesterol, Total: 176 mg/dL (ref 100–199)
HDL: 38 mg/dL — ABNORMAL LOW (ref >39)
LDL Chol Calc (NIH): 113 mg/dL — ABNORMAL HIGH (ref 0–99)
Triglycerides: 139 mg/dL (ref 0–149)
VLDL Cholesterol Cal: 25 mg/dL (ref 5–40)

## 2021-09-21 LAB — VITAMIN D 25 HYDROXY (VIT D DEFICIENCY, FRACTURES): Vit D, 25-Hydroxy: 35.1 ng/mL (ref 30.0–100.0)

## 2021-09-21 LAB — TSH: TSH: 2.34 u[IU]/mL (ref 0.450–4.500)

## 2021-10-20 ENCOUNTER — Other Ambulatory Visit: Payer: No Typology Code available for payment source

## 2021-10-20 DIAGNOSIS — R7989 Other specified abnormal findings of blood chemistry: Secondary | ICD-10-CM

## 2021-10-21 LAB — COMPREHENSIVE METABOLIC PANEL
ALT: 44 IU/L (ref 0–44)
AST: 32 IU/L (ref 0–40)
Albumin/Globulin Ratio: 1.6 (ref 1.2–2.2)
Albumin: 4.6 g/dL (ref 3.8–4.9)
Alkaline Phosphatase: 76 IU/L (ref 44–121)
BUN/Creatinine Ratio: 14 (ref 9–20)
BUN: 16 mg/dL (ref 6–24)
Bilirubin Total: 0.7 mg/dL (ref 0.0–1.2)
CO2: 24 mmol/L (ref 20–29)
Calcium: 9.4 mg/dL (ref 8.7–10.2)
Chloride: 104 mmol/L (ref 96–106)
Creatinine, Ser: 1.18 mg/dL (ref 0.76–1.27)
Globulin, Total: 2.8 g/dL (ref 1.5–4.5)
Glucose: 101 mg/dL — ABNORMAL HIGH (ref 70–99)
Potassium: 3.8 mmol/L (ref 3.5–5.2)
Sodium: 141 mmol/L (ref 134–144)
Total Protein: 7.4 g/dL (ref 6.0–8.5)
eGFR: 74 mL/min/{1.73_m2} (ref >59)

## 2021-10-23 NOTE — Progress Notes (Signed)
Please let the patient know that recheck of his liver functions show  that they  are now normal. Will recheck at next visit.  Thanks  -HB

## 2022-03-22 ENCOUNTER — Other Ambulatory Visit: Payer: Self-pay

## 2022-03-22 DIAGNOSIS — I10 Essential (primary) hypertension: Secondary | ICD-10-CM

## 2022-03-22 MED ORDER — OLMESARTAN-AMLODIPINE-HCTZ 40-5-12.5 MG PO TABS: ORAL_TABLET | ORAL | 0 refills | Status: DC

## 2022-03-23 ENCOUNTER — Ambulatory Visit: Payer: No Typology Code available for payment source | Admitting: Physician Assistant

## 2022-03-27 ENCOUNTER — Telehealth: Payer: Self-pay

## 2022-03-27 ENCOUNTER — Other Ambulatory Visit: Payer: Self-pay | Admitting: Nurse Practitioner

## 2022-03-27 DIAGNOSIS — I10 Essential (primary) hypertension: Secondary | ICD-10-CM

## 2022-03-27 MED ORDER — OLMESARTAN MEDOXOMIL 40 MG PO TABS: 40.0000 mg | ORAL_TABLET | Freq: Every day | ORAL | 1 refills | Status: DC

## 2022-03-27 MED ORDER — AMLODIPINE BESYLATE 5 MG PO TABS: 5.0000 mg | ORAL_TABLET | Freq: Every day | ORAL | 1 refills | Status: DC

## 2022-03-27 MED ORDER — HYDROCHLOROTHIAZIDE 12.5 MG PO TABS: 12.5000 mg | ORAL_TABLET | Freq: Every day | ORAL | 1 refills | Status: DC

## 2022-03-27 NOTE — Telephone Encounter (Signed)
Please let the patient know that I separated the medications to olmesartan 40 mg,amlodipine '5mg'$ , and HCTZ 12.5 mg. All are daily. When the combination is available, we can go back to that.  Thanks so much.   -HB

## 2022-03-27 NOTE — Telephone Encounter (Signed)
Pt wife calling requesting a different option as the current medication Olmesartan-amLODIPine-HCTZ 40-5-12.5 MG TABS isn't available until at least April at all the pharmacies she has called.   Pharmacy: [facility] # [address] [place]    Please advise

## 2022-04-13 ENCOUNTER — Ambulatory Visit: Payer: No Typology Code available for payment source | Admitting: Physician Assistant

## 2022-05-10 ENCOUNTER — Ambulatory Visit: Payer: No Typology Code available for payment source | Admitting: Family Medicine

## 2022-06-21 ENCOUNTER — Ambulatory Visit: Payer: No Typology Code available for payment source | Admitting: Family Medicine

## 2022-09-20 ENCOUNTER — Ambulatory Visit (INDEPENDENT_AMBULATORY_CARE_PROVIDER_SITE_OTHER): Payer: No Typology Code available for payment source | Admitting: Family Medicine

## 2022-09-20 ENCOUNTER — Encounter: Payer: Self-pay | Admitting: Family Medicine

## 2022-09-20 VITALS — BP 133/84 | HR 88 | Ht 72.0 in | Wt 304.0 lb

## 2022-09-20 DIAGNOSIS — R7303 Prediabetes: Secondary | ICD-10-CM

## 2022-09-20 DIAGNOSIS — I10 Essential (primary) hypertension: Secondary | ICD-10-CM

## 2022-09-20 DIAGNOSIS — Z6837 Body mass index (BMI) 37.0-37.9, adult: Secondary | ICD-10-CM

## 2022-09-20 LAB — POCT GLYCOSYLATED HEMOGLOBIN (HGB A1C): HbA1c POC (<> result, manual entry): 5.9 % (ref 4.0–5.6)

## 2022-09-20 MED ORDER — HYDROCHLOROTHIAZIDE 12.5 MG PO TABS
12.5000 mg | ORAL_TABLET | Freq: Every day | ORAL | 3 refills | Status: DC
Start: 2022-09-20 — End: 2023-09-24

## 2022-09-20 MED ORDER — AMLODIPINE BESYLATE 5 MG PO TABS: 5.0000 mg | ORAL_TABLET | Freq: Every day | ORAL | 3 refills | Status: DC

## 2022-09-20 MED ORDER — OLMESARTAN MEDOXOMIL 40 MG PO TABS: 40.0000 mg | ORAL_TABLET | Freq: Every day | ORAL | 3 refills | Status: DC

## 2022-09-20 NOTE — Assessment & Plan Note (Signed)
Doing well.  Continue medications.  Does not want a combo pill.  BMP today.

## 2022-09-20 NOTE — Progress Notes (Signed)
   Established Patient Office Visit  Subjective   Patient ID: [name] [name], male    DOB: 1970/07/25  Age: 52 y.o. MRN: [id number]  Chief Complaint  Patient presents with   Medical Management of Chronic Issues    HPI Prediabetes-we checked patient's A1c in the office today.  Previous A1c from 1 year ago was 6.1.  Today 5.9.  Patient plans to lose weight with a goal of 225 pounds by the next time I see him in January.  Patient has used calorie counting in the past to lose weight, but he did not stick with it.  This time he plans to stick with it.  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  Hypertension-patient blood pressure is good.  Does not want to try combining the medications he is taking into 1 pill.  Would rather just stay with what is working.  Refill sent in.  Colonoscopy-patient states he does not want to get a colonoscopy or use Cologuard right now.  Wants to talk about it again at a later time.   The 10-year ASCVD risk score (Arnett DK, et al., 2019) is: 7%     ROS    Objective:     BP 133/84   Pulse 88   Ht 6' (1.829 m)   Wt (!) 304 lb (137.9 kg)   SpO2 95%   BMI 41.23 kg/m    Physical Exam General: Alert and oriented CV: Regular rate and rhythm no murmurs Pulmonary: Lungs clear bilaterally GI: Soft, normal bowel sounds.  Large pannus.   Results for orders placed or performed in visit on 09/20/22  POCT HgB A1C  Result Value Ref Range   Hemoglobin A1C     HbA1c POC (<> result, manual entry) 5.9 4.0 - 5.6 %   HbA1c, POC (prediabetic range)     HbA1c, POC (controlled diabetic range)            Assessment & Plan:   Class 2 severe obesity with serious comorbidity and body mass index (BMI) of 37.0 to 37.9 in adult, unspecified obesity type (HCC) -     POCT glycosylated hemoglobin (Hb A1C) -     Lipid panel  Hypertension, unspecified type Assessment & Plan: Doing well.  Continue medications.  Does not  want a combo pill.  BMP today.  Orders: -     Basic metabolic panel -     amLODIPine Besylate; Take 1 tablet (5 mg total) by mouth daily.  Dispense: 90 tablet; Refill: 3 -     hydroCHLOROthiazide; Take 1 tablet (12.5 mg total) by mouth daily.  Dispense: 90 tablet; Refill: 3 -     Olmesartan Medoxomil; Take 1 tablet (40 mg total) by mouth daily.  Dispense: 90 tablet; Refill: 3  BMI 37.0-37.9, adult Assessment & Plan: Patient states he is starting a weight loss program.  Will be counting calories.  Has limited himself to 1700 cal.  Has a food diary and wife has a calorie counting app.   Prediabetes Assessment & Plan: Improved from last time has gone from 6.1-5.9.  Recheck again at next visit after patient has had time to lose weight.      Return in about 5 months (around 02/20/2023) for hld, HTN, weight.    Sandre Kitty, MD

## 2022-09-20 NOTE — Assessment & Plan Note (Signed)
Patient states he is starting a weight loss program.  Will be counting calories.  Has limited himself to 1700 cal.  Has a food diary and wife has a calorie counting app.

## 2022-09-20 NOTE — Assessment & Plan Note (Signed)
Improved from last time has gone from 6.1-5.9.  Recheck again at next visit after patient has had time to lose weight.

## 2022-09-20 NOTE — Patient Instructions (Signed)
It was nice to see you today,  We addressed the following topics today: -I have sent in refills for your medications - I have ordered some blood testing for your cholesterol and kidney function.  We will let you know the results we will get them - 1700 -calorie/day is a good goal for weight loss.  We will follow-up on the weight again in January  Have a great day,  Frederic Jericho, MD

## 2023-02-25 ENCOUNTER — Ambulatory Visit: Payer: No Typology Code available for payment source | Admitting: Family Medicine

## 2023-03-26 ENCOUNTER — Ambulatory Visit: Payer: No Typology Code available for payment source | Admitting: Family Medicine

## 2023-05-08 ENCOUNTER — Ambulatory Visit: Payer: No Typology Code available for payment source | Admitting: Family Medicine

## 2023-06-20 ENCOUNTER — Ambulatory Visit: Admitting: Family Medicine

## 2023-08-25 ENCOUNTER — Other Ambulatory Visit: Payer: Self-pay | Admitting: Family Medicine

## 2023-08-25 DIAGNOSIS — I10 Essential (primary) hypertension: Secondary | ICD-10-CM

## 2023-09-24 ENCOUNTER — Other Ambulatory Visit: Payer: Self-pay | Admitting: Family Medicine

## 2023-09-24 DIAGNOSIS — I10 Essential (primary) hypertension: Secondary | ICD-10-CM

## 2023-09-24 MED ORDER — OLMESARTAN MEDOXOMIL 40 MG PO TABS
40.0000 mg | ORAL_TABLET | Freq: Every day | ORAL | 0 refills | Status: DC
Start: 2023-09-24 — End: 2023-12-19

## 2023-09-24 MED ORDER — AMLODIPINE BESYLATE 5 MG PO TABS: 5.0000 mg | ORAL_TABLET | Freq: Every day | ORAL | 0 refills | Status: DC

## 2023-09-24 MED ORDER — HYDROCHLOROTHIAZIDE 12.5 MG PO TABS
12.5000 mg | ORAL_TABLET | Freq: Every day | ORAL | 0 refills | Status: DC
Start: 2023-09-24 — End: 2023-12-19

## 2023-09-24 NOTE — Telephone Encounter (Signed)
 Copied from CRM #[id number]. Topic: Clinical - Medication Refill >> Sep 24, 2023  9:50 AM Wess RAMAN wrote: Medication: amLODipine  (NORVASC ) 5 MG tablet  hydrochlorothiazide  (HYDRODIURIL ) 12.5 MG tablet  olmesartan  (BENICAR ) 40 MG tablet   Has the patient contacted their pharmacy? No (Agent: If no, request that the patient contact the pharmacy for the refill. If patient does not wish to contact the pharmacy document the reason why and proceed with request.) (Agent: If yes, when and what did the pharmacy advise?)  This is the patient's preferred pharmacy:  [facility] # [address], [place] - [address] [address] [place] [address] Phone: [phone] Fax: [phone]  Is this the correct pharmacy for this prescription? Yes If no, delete pharmacy and type the correct one.   Has the prescription been filled recently? Yes  Is the patient out of the medication? Yes  Has the patient been seen for an appointment in the last year OR does the patient have an upcoming appointment? Yes  Can we respond through MyChart? No  Agent: Please be advised that Rx refills may take up to 3 business days. We ask that you follow-up with your pharmacy.

## 2023-10-04 ENCOUNTER — Encounter: Payer: Self-pay | Admitting: Family Medicine

## 2023-10-04 ENCOUNTER — Ambulatory Visit (INDEPENDENT_AMBULATORY_CARE_PROVIDER_SITE_OTHER): Payer: Self-pay | Admitting: Family Medicine

## 2023-10-04 VITALS — BP 120/78 | HR 89 | Ht 72.0 in | Wt 296.2 lb

## 2023-10-04 DIAGNOSIS — I10 Essential (primary) hypertension: Secondary | ICD-10-CM

## 2023-10-04 DIAGNOSIS — Z0000 Encounter for general adult medical examination without abnormal findings: Secondary | ICD-10-CM

## 2023-10-04 DIAGNOSIS — E782 Mixed hyperlipidemia: Secondary | ICD-10-CM

## 2023-10-04 DIAGNOSIS — E785 Hyperlipidemia, unspecified: Secondary | ICD-10-CM | POA: Insufficient documentation

## 2023-10-04 DIAGNOSIS — Z6837 Body mass index (BMI) 37.0-37.9, adult: Secondary | ICD-10-CM

## 2023-10-04 NOTE — Patient Instructions (Signed)
 It was nice to see you today,  We addressed the following topics today: - great job on improving your diet and the weight loss.   - I would like to see you back in 6 months to make sure your weight has continued to improve.    Have a great day,  Rolan Slain, MD

## 2023-10-04 NOTE — Progress Notes (Signed)
   Established Patient Office Visit  Subjective   Patient ID: [name] [name], male    DOB: Jan 01, 1971  Age: 53 y.o. MRN: [id number]  Chief Complaint  Patient presents with   Medical Management of Chronic Issues    HPI  Subjective - Reports feeling very well, the best in a long time. - Discussed recent lifestyle changes following his brother's death on Jul 03, 2023 from aspiration pneumonia. This event prompted him to focus on his health. - Started yoga, walking 10,000 steps daily, and dietary changes. - Reports a 34-pound weight loss from 330 pounds over the last three months. Goal weight is 200-225 pounds. - Describes being thin most of his life until gaining weight around age [age]. - Calorie counting to 1500 calories/day, tracking manually. Allows one cheat day on Mondays up to 2000 calories. - Daughter is a Marine scientist and assists with stretching, which has been very helpful. - Reports no pain. 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 - Enjoys playing golf.  Medications Continues Motrin, lorazepam, hydrochlorothiazide , olmesartan , and vitamin D . Refills were provided for three months at the last visit.  PMH, PSH, FH, Social Hx PMHx: History of elevated cholesterol, not on medication. FHx: Brother passed away at age 73 on Jul 03, 2023 from aspiration pneumonia post-vomiting in sleep. No known history of heart disease in brother. Mother was a smoker. Social Hx: Works as a Heritage manager (semi-retired). Never smoked or drank alcohol. Plays golf for exercise and social engagement. 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  ROS Constitutional: Reports significant weight loss. Denies pain. Psychiatric: Reports feeling well.  The 10-year ASCVD risk score (Arnett DK, et al., 2019) is: 6.5%  Health Maintenance Due  Topic Date Due   HIV Screening  Never done   DTaP/Tdap/Td (1 - Tdap) Never done   Hepatitis B Vaccines 19-59 Average Risk (1 of 3 -  19+ 3-dose series) Never done   Colonoscopy  Never done   Pneumococcal Vaccine: 50+ Years (1 of 1 - PCV) Never done   Zoster Vaccines- Shingrix (1 of 2) Never done   COVID-19 Vaccine (1 - 2024-25 season) Never done   INFLUENZA VACCINE  09/20/2023      Objective:     BP 120/78   Pulse 89   Ht 6' (1.829 m)   Wt 296 lb 4 oz (134.4 kg)   SpO2 97%   BMI 40.18 kg/m    Physical Exam General: Appears well. HEENT: Oropharynx clear. EOMI. CV: RRR, no m/r/g. PULM: Lungs CTA bilaterally. Gi: large pannus.   Msk: normal gait.   Ext: no pedal edema Psych: pleasant affect   No results found for any visits on 10/04/23.      Assessment & Plan:   Physical exam, annual  BMI 37.0-37.9, adult Assessment & Plan: Has lost 30+ lbs with calorie counting and exercise.  Peak weight was 330 lbs. No 296 lbs.  Inspired to be healthier after his brother died last may.   - f/u 6 months from now for recheck of weight   Hypertension, unspecified type Assessment & Plan: Bp controlled today.  Continue amlodipine , hydrochlorothiazide , olmesartan .     Moderate mixed hyperlipidemia not requiring statin therapy Assessment & Plan: Declined lipid panel today.  Recheck at next visit.  Should be improved as he continues to lose weight.       Return in about 6 months (around 04/05/2024) for weight, htn.    Toribio MARLA Slain, MD

## 2023-10-04 NOTE — Assessment & Plan Note (Signed)
 Has lost 30+ lbs with calorie counting and exercise.  Peak weight was 330 lbs. No 296 lbs.  Inspired to be healthier after his brother died last may.   - f/u 6 months from now for recheck of weight

## 2023-10-04 NOTE — Assessment & Plan Note (Signed)
 Declined lipid panel today.  Recheck at next visit.  Should be improved as he continues to lose weight.

## 2023-10-04 NOTE — Assessment & Plan Note (Signed)
 Bp controlled today.  Continue amlodipine , hydrochlorothiazide , olmesartan .

## 2023-12-19 ENCOUNTER — Other Ambulatory Visit: Payer: Self-pay | Admitting: Family Medicine

## 2023-12-19 DIAGNOSIS — I10 Essential (primary) hypertension: Secondary | ICD-10-CM

## 2024-03-23 ENCOUNTER — Telehealth: Payer: Self-pay | Admitting: *Deleted

## 2024-03-23 DIAGNOSIS — I10 Essential (primary) hypertension: Secondary | ICD-10-CM

## 2024-03-23 MED ORDER — OLMESARTAN MEDOXOMIL 40 MG PO TABS: 40.0000 mg | ORAL_TABLET | Freq: Every day | ORAL | 0 refills | Status: AC

## 2024-03-23 MED ORDER — HYDROCHLOROTHIAZIDE 12.5 MG PO TABS: 12.5000 mg | ORAL_TABLET | Freq: Every day | ORAL | 0 refills | Status: AC

## 2024-03-23 MED ORDER — AMLODIPINE BESYLATE 5 MG PO TABS: 5.0000 mg | ORAL_TABLET | Freq: Every day | ORAL | 0 refills | Status: AC

## 2024-03-23 NOTE — Telephone Encounter (Signed)
 Copied from CRM [id number]. Topic: Clinical - Medication Refill >> Mar 23, 2024 11:42 AM Wess RAMAN wrote: Medication: amLODipine  (NORVASC ) 5 MG tablet  hydrochlorothiazide  (HYDRODIURIL ) 12.5 MG tablet  olmesartan  (BENICAR ) 40 MG tablet  Has the patient contacted their pharmacy? Yes (Agent: If no, request that the patient contact the pharmacy for the refill. If patient does not wish to contact the pharmacy document the reason why and proceed with request.) (Agent: If yes, when and what did the pharmacy advise?)  This is the patient's preferred pharmacy:  [facility] # [address], [place] - [address] [place] [address] Phone: [phone] Fax: [phone]  Is this the correct pharmacy for this prescription? Yes If no, delete pharmacy and type the correct one.   Has the prescription been filled recently? Yes  Is the patient out of the medication? No  Has the patient been seen for an appointment in the last year OR does the patient have an upcoming appointment? Yes  Can we respond through MyChart? No  Agent: Please be advised that Rx refills may take up to 3 business days. We ask that you follow-up with your pharmacy.

## 2024-04-03 ENCOUNTER — Ambulatory Visit: Payer: Self-pay | Admitting: Family Medicine

## 2024-04-30 ENCOUNTER — Ambulatory Visit: Payer: Self-pay | Admitting: Family Medicine
